# Patient Record
Sex: Female | Born: 1960 | Race: White | Hispanic: No | Smoking: Never smoker
Health system: Southern US, Community
[De-identification: ages and names within clinical notes are randomized; demographics above are authoritative.]

## PROBLEM LIST (undated history)

## (undated) DIAGNOSIS — I471 Supraventricular tachycardia, unspecified: Secondary | ICD-10-CM

## (undated) DIAGNOSIS — N809 Endometriosis, unspecified: Secondary | ICD-10-CM

## (undated) DIAGNOSIS — B0052 Herpesviral keratitis: Secondary | ICD-10-CM

## (undated) DIAGNOSIS — J9819 Other pulmonary collapse: Secondary | ICD-10-CM

## (undated) HISTORY — PX: ABDOMINAL HYSTERECTOMY: SHX81

## (undated) HISTORY — DX: Herpesviral keratitis: B00.52

## (undated) HISTORY — PX: PARTIAL HYSTERECTOMY: SHX80

---

## 1998-08-30 ENCOUNTER — Inpatient Hospital Stay (HOSPITAL_COMMUNITY): Admission: RE | Admit: 1998-08-30 | Discharge: 1998-09-01 | Payer: Self-pay | Admitting: Obstetrics and Gynecology

## 1999-12-26 ENCOUNTER — Other Ambulatory Visit: Admission: RE | Admit: 1999-12-26 | Discharge: 1999-12-26 | Payer: Self-pay | Admitting: Obstetrics and Gynecology

## 2001-01-26 ENCOUNTER — Other Ambulatory Visit: Admission: RE | Admit: 2001-01-26 | Discharge: 2001-01-26 | Payer: Self-pay | Admitting: Obstetrics and Gynecology

## 2004-01-31 ENCOUNTER — Other Ambulatory Visit: Admission: RE | Admit: 2004-01-31 | Discharge: 2004-01-31 | Payer: Self-pay | Admitting: Obstetrics and Gynecology

## 2005-02-12 ENCOUNTER — Other Ambulatory Visit: Admission: RE | Admit: 2005-02-12 | Discharge: 2005-02-12 | Payer: Self-pay | Admitting: Obstetrics and Gynecology

## 2009-09-25 ENCOUNTER — Encounter: Admission: RE | Admit: 2009-09-25 | Discharge: 2009-09-25 | Payer: Self-pay | Admitting: Obstetrics and Gynecology

## 2010-12-22 ENCOUNTER — Encounter: Payer: Self-pay | Admitting: Obstetrics and Gynecology

## 2011-08-29 DIAGNOSIS — H169 Unspecified keratitis: Secondary | ICD-10-CM

## 2011-08-29 HISTORY — DX: Unspecified keratitis: H16.9

## 2012-01-09 DIAGNOSIS — H04123 Dry eye syndrome of bilateral lacrimal glands: Secondary | ICD-10-CM

## 2012-01-09 DIAGNOSIS — H52202 Unspecified astigmatism, left eye: Secondary | ICD-10-CM | POA: Insufficient documentation

## 2012-01-09 HISTORY — DX: Unspecified astigmatism, left eye: H52.202

## 2012-01-09 HISTORY — DX: Dry eye syndrome of bilateral lacrimal glands: H04.123

## 2013-05-08 DIAGNOSIS — K120 Recurrent oral aphthae: Secondary | ICD-10-CM

## 2013-05-08 HISTORY — DX: Recurrent oral aphthae: K12.0

## 2013-12-10 ENCOUNTER — Encounter (HOSPITAL_BASED_OUTPATIENT_CLINIC_OR_DEPARTMENT_OTHER): Payer: Self-pay | Admitting: Emergency Medicine

## 2013-12-10 ENCOUNTER — Emergency Department (HOSPITAL_BASED_OUTPATIENT_CLINIC_OR_DEPARTMENT_OTHER)
Admission: EM | Admit: 2013-12-10 | Discharge: 2013-12-10 | Disposition: A | Discharge status: Home / Self Care | Payer: BC Managed Care – PPO | Attending: Emergency Medicine | Admitting: Emergency Medicine

## 2013-12-10 DIAGNOSIS — W540XXA Bitten by dog, initial encounter: Secondary | ICD-10-CM | POA: Insufficient documentation

## 2013-12-10 DIAGNOSIS — I471 Supraventricular tachycardia, unspecified: Secondary | ICD-10-CM

## 2013-12-10 DIAGNOSIS — S61409A Unspecified open wound of unspecified hand, initial encounter: Secondary | ICD-10-CM | POA: Insufficient documentation

## 2013-12-10 DIAGNOSIS — Z3202 Encounter for pregnancy test, result negative: Secondary | ICD-10-CM | POA: Insufficient documentation

## 2013-12-10 DIAGNOSIS — R42 Dizziness and giddiness: Secondary | ICD-10-CM | POA: Insufficient documentation

## 2013-12-10 DIAGNOSIS — Y939 Activity, unspecified: Secondary | ICD-10-CM | POA: Insufficient documentation

## 2013-12-10 DIAGNOSIS — E876 Hypokalemia: Secondary | ICD-10-CM

## 2013-12-10 DIAGNOSIS — I498 Other specified cardiac arrhythmias: Secondary | ICD-10-CM | POA: Insufficient documentation

## 2013-12-10 DIAGNOSIS — S61451A Open bite of right hand, initial encounter: Secondary | ICD-10-CM

## 2013-12-10 DIAGNOSIS — E860 Dehydration: Secondary | ICD-10-CM

## 2013-12-10 DIAGNOSIS — Z79899 Other long term (current) drug therapy: Secondary | ICD-10-CM | POA: Insufficient documentation

## 2013-12-10 DIAGNOSIS — Y929 Unspecified place or not applicable: Secondary | ICD-10-CM | POA: Insufficient documentation

## 2013-12-10 HISTORY — DX: Supraventricular tachycardia, unspecified: I47.10

## 2013-12-10 HISTORY — DX: Supraventricular tachycardia: I47.1

## 2013-12-10 LAB — CBC WITH DIFFERENTIAL/PLATELET
BASOS ABS: 0 10*3/uL (ref 0.0–0.1)
BASOS PCT: 0 % (ref 0–1)
EOS ABS: 0 10*3/uL (ref 0.0–0.7)
EOS PCT: 0 % (ref 0–5)
HCT: 42.3 % (ref 36.0–46.0)
HEMOGLOBIN: 14.1 g/dL (ref 12.0–15.0)
LYMPHS ABS: 2 10*3/uL (ref 0.7–4.0)
Lymphocytes Relative: 17 % (ref 12–46)
MCH: 31.1 pg (ref 26.0–34.0)
MCHC: 33.3 g/dL (ref 30.0–36.0)
MCV: 93.2 fL (ref 78.0–100.0)
MONO ABS: 0.5 10*3/uL (ref 0.1–1.0)
MONOS PCT: 4 % (ref 3–12)
Neutro Abs: 9.2 10*3/uL — ABNORMAL HIGH (ref 1.7–7.7)
Neutrophils Relative %: 79 % — ABNORMAL HIGH (ref 43–77)
Platelets: 283 10*3/uL (ref 150–400)
RBC: 4.54 MIL/uL (ref 3.87–5.11)
RDW: 13.3 % (ref 11.5–15.5)
WBC: 11.7 10*3/uL — AB (ref 4.0–10.5)

## 2013-12-10 LAB — BASIC METABOLIC PANEL
BUN: 24 mg/dL — AB (ref 6–23)
CALCIUM: 9.8 mg/dL (ref 8.4–10.5)
CO2: 26 mEq/L (ref 19–32)
Chloride: 102 mEq/L (ref 96–112)
Creatinine, Ser: 0.8 mg/dL (ref 0.50–1.10)
GFR calc Af Amer: >90 mL/min (ref >90)
GFR, EST NON AFRICAN AMERICAN: 84 mL/min — AB (ref >90)
Glucose, Bld: 182 mg/dL — ABNORMAL HIGH (ref 70–99)
Potassium: 3.6 mEq/L — ABNORMAL LOW (ref 3.7–5.3)
Sodium: 144 mEq/L (ref 137–147)

## 2013-12-10 LAB — URINALYSIS, ROUTINE W REFLEX MICROSCOPIC
BILIRUBIN URINE: NEGATIVE
GLUCOSE, UA: 100 mg/dL — AB
HGB URINE DIPSTICK: NEGATIVE
Ketones, ur: NEGATIVE mg/dL
LEUKOCYTES UA: NEGATIVE
NITRITE: NEGATIVE
PROTEIN: NEGATIVE mg/dL
SPECIFIC GRAVITY, URINE: 1.003 — AB (ref 1.005–1.030)
Urobilinogen, UA: 0.2 mg/dL (ref 0.0–1.0)
pH: 7 (ref 5.0–8.0)

## 2013-12-10 LAB — PREGNANCY, URINE: Preg Test, Ur: NEGATIVE

## 2013-12-10 MED ORDER — POTASSIUM CHLORIDE CRYS ER 20 MEQ PO TBCR
40.0000 meq | EXTENDED_RELEASE_TABLET | Freq: Once | ORAL | Status: AC
  Administered 2013-12-10: 40 meq via ORAL
  Filled 2013-12-10: qty 2

## 2013-12-10 MED ORDER — AMOXICILLIN-POT CLAVULANATE 875-125 MG PO TABS: 1.0000 | ORAL_TABLET | Freq: Two times a day (BID) | ORAL | Status: DC

## 2013-12-10 MED ORDER — ADENOSINE 6 MG/2ML IV SOLN
INTRAVENOUS | Status: AC
  Filled 2013-12-10: qty 6

## 2013-12-10 NOTE — Discharge Instructions (Signed)
If you were given medicines take as directed.  If you are on coumadin or contraceptives realize their levels and effectiveness is altered by many different medicines.  If you have any reaction (rash, tongues swelling, other) to the medicines stop taking and see a physician.   Please follow up as directed and return to the ER or see a physician for new or worsening symptoms.  Thank you.  Animal Bite An animal bite can result in a scratch on the skin, deep open cut, puncture of the skin, crush injury, or tearing away of the skin or a body part. Dogs are responsible for most animal bites. Children are bitten more often than adults. An animal bite can range from very mild to more serious. A small bite from your house pet is no cause for alarm. However, some animal bites can become infected or injure a bone or other tissue. You must seek medical care if:  The skin is broken and bleeding does not slow down or stop after 15 minutes.  The puncture is deep and difficult to clean (such as a cat bite).  Pain, warmth, redness, or pus develops around the wound.  The bite is from a stray animal or rodent. There may be a risk of rabies infection.  The bite is from a snake, raccoon, skunk, fox, coyote, or bat. There may be a risk of rabies infection.  The person bitten has a chronic illness such as diabetes, liver disease, or cancer, or the person takes medicine that lowers the immune system.  There is concern about the location and severity of the bite. It is important to clean and protect an animal bite wound right away to prevent infection. Follow these steps:  Clean the wound with plenty of water and soap.  Apply an antibiotic cream.  Apply gentle pressure over the wound with a clean towel or gauze to slow or stop bleeding.  Elevate the affected area above the heart to help stop any bleeding.  Seek medical care. Getting medical care within 8 hours of the animal bite leads to the best possible  outcome. DIAGNOSIS  Your caregiver will most likely:  Take a detailed history of the animal and the bite injury.  Perform a wound exam.  Take your medical history. Blood tests or X-rays may be performed. Sometimes, infected bite wounds are cultured and sent to a lab to identify the infectious bacteria.  TREATMENT  Medical treatment will depend on the location and type of animal bite as well as the patient's medical history. Treatment may include:  Wound care, such as cleaning and flushing the wound with saline solution, bandaging, and elevating the affected area.  Antibiotics.  Tetanus immunization.  Rabies immunization.  Leaving the wound open to heal. This is often done with animal bites, due to the high risk of infection. However, in certain cases, wound closure with stitches, wound adhesive, skin adhesive strips, or staples may be used. Infected bites that are left untreated may require intravenous (IV) antibiotics and surgical treatment in the hospital. HOME CARE INSTRUCTIONS  Follow your caregiver's instructions for wound care.  Take all medicines as directed.  If your caregiver prescribes antibiotics, take them as directed. Finish them even if you start to feel better.  Follow up with your caregiver for further exams or immunizations as directed. You may need a tetanus shot if:  You cannot remember when you had your last tetanus shot.  You have never had a tetanus shot.  The injury  broke your skin. If you get a tetanus shot, your arm may swell, get red, and feel warm to the touch. This is common and not a problem. If you need a tetanus shot and you choose not to have one, there is a rare chance of getting tetanus. Sickness from tetanus can be serious. SEEK MEDICAL CARE IF:  You notice warmth, redness, soreness, swelling, pus discharge, or a bad smell coming from the wound.  You have a red line on the skin coming from the wound.  You have a fever, chills, or a  general ill feeling.  You have nausea or vomiting.  You have continued or worsening pain.  You have trouble moving the injured part.  You have other questions or concerns. MAKE SURE YOU:  Understand these instructions.  Will watch your condition.  Will get help right away if you are not doing well or get worse. Document Released: 08/05/2011 Document Revised: 02/09/2012 Document Reviewed: 08/05/2011 [facility] Patient Information 2014 [employer] [place].

## 2013-12-10 NOTE — ED Provider Notes (Signed)
CSN: [id number]     Arrival date & time 12/10/13  1148 History   First MD Initiated Contact with Patient 12/10/13 1214     Chief Complaint  Patient presents with  . SVT   . Animal Bite   (Consider location/radiation/quality/duration/timing/severity/associated sxs/prior Treatment) HPI Comments: 53 yo female with SVT hx presents with right hand swelling from dog bite on Thursday, her own dog who has had Rabies vaccinations.  Mild right hand swelling, no fevers. Tetanus UTD.  Pt felt palpitations this am, constant, similar to previous SVT that she received adenosine.  No other heart issues.  NO chol, smoking, dm or htn. Pt on muscle relaxant. Pain with palpation of right hand.   Patient denies blood clot history, active cancer, recent major trauma or surgery, unilateral leg swelling/ pain, recent long travel, hemoptysis or oral contraceptives.   Patient is a 53 y.o. female presenting with animal bite. The history is provided by the patient.  Animal Bite Associated symptoms: no fever and no rash     Past Medical History  Diagnosis Date  . SVT (supraventricular tachycardia)    History reviewed. No pertinent past surgical history. No family history on file. History  Substance Use Topics  . Smoking status: Never Smoker   . Smokeless tobacco: Not on file  . Alcohol Use: Not on file   OB History   Grav Para Term Preterm Abortions TAB SAB Ect Mult Living                 Review of Systems  Constitutional: Negative for fever and chills.  HENT: Negative for congestion.   Eyes: Negative for visual disturbance.  Respiratory: Negative for shortness of breath.   Cardiovascular: Positive for palpitations. Negative for chest pain.  Gastrointestinal: Negative for vomiting and abdominal pain.  Genitourinary: Negative for dysuria and flank pain.  Musculoskeletal: Positive for joint swelling. Negative for back pain, neck pain and neck stiffness.  Skin: Positive for wound. Negative for rash.   Neurological: Positive for light-headedness. Negative for headaches.    Allergies  Review of patient's allergies indicates no known allergies.  Home Medications   Current Outpatient Rx  Name  Route  Sig  Dispense  Refill  . cyclobenzaprine (FLEXERIL) 10 MG tablet   Oral   Take 10 mg by mouth 3 (three) times daily as needed for muscle spasms.         Marland Kitchen dexamethasone (DECADRON) 4 MG tablet   Oral   Take 4 mg by mouth 2 (two) times daily with a meal.         . HYDROcodone-ibuprofen (VICOPROFEN) 7.5-200 MG per tablet   Oral   Take 1 tablet by mouth every 6 (six) hours as needed for moderate pain.         . valACYclovir (VALTREX) 1000 MG tablet   Oral   Take 1,000 mg by mouth 2 (two) times daily.          BP 135/79  Pulse 198  Temp(Src) 97.8 F (36.6 C)  Resp 20  SpO2 100% Physical Exam  Nursing note and vitals reviewed. Constitutional: She is oriented to person, place, and time. She appears well-developed and well-nourished.  HENT:  Head: Normocephalic and atraumatic.  Mild dry mm  Eyes: Conjunctivae are normal. Right eye exhibits no discharge. Left eye exhibits no discharge.  Neck: Normal range of motion. Neck supple. No tracheal deviation present.  Cardiovascular: Regular rhythm.  Tachycardia present.   No murmur heard. Pulmonary/Chest: Effort normal  and breath sounds normal.  Abdominal: Soft. She exhibits no distension. There is no tenderness. There is no guarding.  Musculoskeletal: She exhibits no edema and no tenderness.  Neurological: She is alert and oriented to person, place, and time. No cranial nerve deficit.  Skin: Skin is warm. No rash noted.  Psychiatric: She has a normal mood and affect.    ED Course  Procedures (including critical care time) Labs Review Labs Reviewed  BASIC METABOLIC PANEL - Abnormal; Notable for the following:    Potassium 3.6 (*)    Glucose, Bld 182 (*)    BUN 24 (*)    GFR calc non Af Amer 84 (*)    All other  components within normal limits  URINALYSIS, ROUTINE W REFLEX MICROSCOPIC - Abnormal; Notable for the following:    Specific Gravity, Urine 1.003 (*)    Glucose, UA 100 (*)    All other components within normal limits  PREGNANCY, URINE  CBC WITH DIFFERENTIAL   Imaging Review No results found.  EKG Interpretation    Date/Time:  Saturday December 10 2013 12:30:26 EST Ventricular Rate:  103 PR Interval:  150 QRS Duration: 117 QT Interval:  355 QTC Calculation: 465 R Axis:   84 Text Interpretation:  Sinus tachycardia Nonspecific intraventricular conduction delay Confirmed by Don Giarrusso  MD, Tylynn Braniff ([id number]) on 12/10/2013 1:04:24 PM           Date: 12/10/2013  Rate: 196  Rhythm: supraventricular tachycardia (SVT)  QRS Axis: normal  Intervals: normal  ST/T Wave abnormalities: ST depressions diffusely  Conduction Disutrbances:none  Narrative Interpretation:   Old EKG Reviewed: none available    MDM   1. SVT (supraventricular tachycardia)   2. Dehydration   3. Animal bite of hand, right, initial encounter   4. Hypokalemia    Dog bite, mild swelling, no streaking or active infection but since hand/ swelling/ bite and high risk augmentin and close fup outpt.  SVT - similar to previous- ekg normalized on repeat.  Pt did not require adenosine, stopped just prior to.  Vagal maneuvers done.  Discussed fup with cardiology. IV fluids given. PO K given. Labs done. Pt improved in ED.    Results and differential diagnosis were discussed with the patient. Close follow up outpatient was discussed, patient comfortable with the plan.   Diagnosis: above     Enid SkeensJoshua M Julie-Ann Vanmaanen, MD 12/10/13 [id number]

## 2013-12-10 NOTE — ED Notes (Signed)
Patient here for swelling to right hand after here dog bit her on Thursday, minimal swelling and small abrasion noted. On assessment HR noted to be 198, patient states she has history of same that has required intervention in past, complains of feeling anxious, no CP

## 2013-12-13 ENCOUNTER — Encounter: Payer: Self-pay | Admitting: Cardiovascular Disease

## 2013-12-13 ENCOUNTER — Ambulatory Visit (INDEPENDENT_AMBULATORY_CARE_PROVIDER_SITE_OTHER): Payer: BC Managed Care – PPO | Admitting: Cardiovascular Disease

## 2013-12-13 VITALS — BP 126/92 | HR 84 | Ht 65.0 in | Wt 163.0 lb

## 2013-12-13 DIAGNOSIS — I471 Supraventricular tachycardia: Secondary | ICD-10-CM | POA: Insufficient documentation

## 2013-12-13 DIAGNOSIS — I498 Other specified cardiac arrhythmias: Secondary | ICD-10-CM

## 2013-12-13 MED ORDER — DILTIAZEM HCL ER COATED BEADS 120 MG PO CP24: 120.0000 mg | ORAL_CAPSULE | Freq: Every day | ORAL | Status: DC

## 2013-12-13 NOTE — Patient Instructions (Signed)
Your physician recommends that you schedule a follow-up appointment in: 6 weeks.   Your physician has requested that you have an echocardiogram. Echocardiography is a painless test that uses sound waves to create images of your heart. It provides your doctor with information about the size and shape of your heart and how well your heart's chambers and valves are working. This procedure takes approximately one hour. There are no restrictions for this procedure.   Your physician has recommended you make the following change in your medication:  Start Cardizem CD 120 mg by mouth daily

## 2013-12-13 NOTE — Progress Notes (Addendum)
History of Present Illness: 53 yo female with history of SVT who is here today to establish cardiology care. She has not been seen in our office before today. She has been told that she has had SVT in the past. She was seen in the ED 12/10/13 with c/o palpitations. She was found to be in a narrow complex tachycardia c/w SVT with HR of 196. She returned to normal sinus rhythm with vagal maneuvers. She notes having several episodes per year over the last ten years. Overall fatigue. No chest pain. Alcohol seems to trigger her episodes.   Primary Care Physician: Malva LimesMark Anderson [facility]  Past Medical History  Diagnosis Date  . SVT (supraventricular tachycardia)   . Keratitis, herpetic     Past Surgical History  Procedure Laterality Date  . Partial hysterectomy      Current Outpatient Prescriptions  Medication Sig Dispense Refill  . cyclobenzaprine (FLEXERIL) 10 MG tablet Take 10 mg by mouth 3 (three) times daily as needed for muscle spasms.      . prednisoLONE acetate (PRED FORTE) 1 % ophthalmic suspension Place 6 drops into the right eye. PUT 6 DROPS ON RIGHT EYE ONLY THROUGHOUT THE DAY      . valACYclovir (VALTREX) 1000 MG tablet Take 1,000 mg by mouth 2 (two) times daily.      Marland Kitchen. amoxicillin-clavulanate (AUGMENTIN) 875-125 MG per tablet Take 1 tablet by mouth 2 (two) times daily. One po bid x 7 days  14 tablet  0  . diltiazem (CARDIZEM CD) 120 MG 24 hr capsule Take 1 capsule (120 mg total) by mouth daily.  30 capsule  11   No current facility-administered medications for this visit.    Allergies  Allergen Reactions  . Amoxicillin     MILD RASH  . Zithromax [Azithromycin]     ITCHING AND RASH ON HER FEET AND PALM    History   Social History  . Marital Status: Married    Spouse Name: N/A    Number of Children: 2  . Years of Education: N/A   Occupational History  . Paralegal    Social History Main Topics  . Smoking status: Never Smoker   . Smokeless tobacco:  Not on file  . Alcohol Use: No  . Drug Use: No  . Sexual Activity: Not on file   Other Topics Concern  . Not on file   Social History Narrative  . No narrative on file    Family History  Problem Relation Age of Onset  . Heart attack Father 1760  . Heart attack Maternal Grandmother   . Heart attack Maternal Grandfather     Review of Systems:  As stated in the HPI and otherwise negative.   BP 126/92  Pulse 84  Ht 5\' 5"  (1.651 m)  Wt 163 lb (73.936 kg)  BMI 27.12 kg/m2  Physical Examination: General: Well developed, well nourished, NAD HEENT: OP clear, mucus membranes moist SKIN: warm, dry. No rashes. Neuro: No focal deficits Musculoskeletal: Muscle strength 5/5 all ext Psychiatric: Mood and affect normal Neck: No JVD, no carotid bruits, no thyromegaly, no lymphadenopathy. Lungs:Clear bilaterally, no wheezes, rhonci, crackles Cardiovascular: Regular rate and rhythm. No murmurs, gallops or rubs. Abdomen:Soft. Bowel sounds present. Non-tender.  Extremities: No lower extremity edema. Pulses are 2 + in the bilateral DP/PT.  EKG: NSR, rate 85 bpm.   Assessment and Plan:   1. SVT: Documented in ED. She notices similar episodes several times per year.  Alcohol seems to be a trigger. I have spent 20 minutes reviewing the etiology of SVT and provided patient education information. Will start Cardizem CD 120 mg po QDaily. She will try taking every day but may end up using as needed. I have gone over vagal maneuvers to use if she has recurrence of symptoms. Will arrange echo to assess LVEF and exclude structural heart disease. She is asked to avoid stimulants such as nicotine, OTC medications with stimulant qualities, alcohol. She will reduce caffeine intake.

## 2013-12-28 ENCOUNTER — Encounter: Payer: Self-pay | Admitting: Cardiology

## 2013-12-28 ENCOUNTER — Ambulatory Visit (HOSPITAL_COMMUNITY): Payer: BC Managed Care – PPO | Attending: Cardiology | Admitting: Cardiology

## 2013-12-28 DIAGNOSIS — I498 Other specified cardiac arrhythmias: Secondary | ICD-10-CM

## 2013-12-28 DIAGNOSIS — I471 Supraventricular tachycardia, unspecified: Secondary | ICD-10-CM | POA: Insufficient documentation

## 2013-12-28 NOTE — Progress Notes (Signed)
Echo performed. 

## 2014-01-26 ENCOUNTER — Ambulatory Visit: Payer: BC Managed Care – PPO | Admitting: Cardiovascular Disease

## 2014-01-30 ENCOUNTER — Ambulatory Visit (INDEPENDENT_AMBULATORY_CARE_PROVIDER_SITE_OTHER): Payer: BC Managed Care – PPO | Admitting: Cardiovascular Disease

## 2014-01-30 ENCOUNTER — Encounter: Payer: Self-pay | Admitting: Cardiovascular Disease

## 2014-01-30 VITALS — BP 124/82 | HR 81 | Ht 65.0 in | Wt 161.4 lb

## 2014-01-30 DIAGNOSIS — I498 Other specified cardiac arrhythmias: Secondary | ICD-10-CM

## 2014-01-30 DIAGNOSIS — I471 Supraventricular tachycardia: Secondary | ICD-10-CM

## 2014-01-30 NOTE — Progress Notes (Signed)
History of Present Illness: 53 yo female with history of SVT who is here today for cardiac follow up. I met her as a new patient 12/13/13 for evaluation of SVT.  She was seen in the [facility] ED 12/10/13 with c/o palpitations. She was found to be in a narrow complex tachycardia c/w SVT with HR of 196. She returned to normal sinus rhythm with vagal maneuvers. She notes having several episodes of tachycardia per year over the last ten years. She reported overall fatigue, but no chest pain. Alcohol seems to trigger her episodes. I added Cardizem CD 120 mg po Qdaily. Echo 12/28/13 with normal LV function, no significant valve issues.   She is here today for follow up. She had not been taking her Cardizem as recommended and had one episode of tachycardia lasting 2 hours last week. Has now started Cardizem CD 120 mg daily and doing well. No recurrence over last week. No other complaints.   Primary Care Physician: Freda Munro ([place])  Past Medical History  Diagnosis Date  . SVT (supraventricular tachycardia)   . Keratitis, herpetic     Past Surgical History  Procedure Laterality Date  . Partial hysterectomy      Current Outpatient Prescriptions  Medication Sig Dispense Refill  . diltiazem (CARDIZEM CD) 120 MG 24 hr capsule Take 1 capsule (120 mg total) by mouth daily.  30 capsule  11  . valACYclovir (VALTREX) 1000 MG tablet Take 1,000 mg by mouth daily.        No current facility-administered medications for this visit.    Allergies  Allergen Reactions  . Amoxicillin     MILD RASH  . Zithromax [Azithromycin] Other (See Comments)    ITCHING AND RASH ON HER FEET AND PALM    History   Social History  . Marital Status: Married    Spouse Name: N/A    Number of Children: 2  . Years of Education: N/A   Occupational History  . Paralegal    Social History Main Topics  . Smoking status: Never Smoker   . Smokeless tobacco: Not on file  . Alcohol Use: No  . Drug Use: No  .  Sexual Activity: Not on file   Other Topics Concern  . Not on file   Social History Narrative  . No narrative on file    Family History  Problem Relation Age of Onset  . Heart attack Father 34  . Heart attack Maternal Grandmother   . Heart attack Maternal Grandfather     Review of Systems:  As stated in the HPI and otherwise negative.   BP 124/82  Pulse 81  Ht 5' 5"  (1.651 m)  Wt 161 lb 6.4 oz (73.211 kg)  BMI 26.86 kg/m2  Physical Examination: General: Well developed, well nourished, NAD HEENT: OP clear, mucus membranes moist SKIN: warm, dry. No rashes. Neuro: No focal deficits Musculoskeletal: Muscle strength 5/5 all ext Psychiatric: Mood and affect normal Neck: No JVD, no carotid bruits, no thyromegaly, no lymphadenopathy. Lungs:Clear bilaterally, no wheezes, rhonci, crackles Cardiovascular: Regular rate and rhythm. No murmurs, gallops or rubs. Abdomen:Soft. Bowel sounds present. Non-tender.  Extremities: No lower extremity edema. Pulses are 2 + in the bilateral DP/PT.  Assessment and Plan:   1. SVT: Documented in ED in January 2015. She had not started Cardizem and had another episodes last week. Echo overall normal. Will start Cardizem CD 120 mg po Qdaily. I have gone over vagal maneuvers to use if she  has recurrence of symptoms. She is asked to avoid stimulants such as nicotine, OTC medications with stimulant qualities, alcohol. She will reduce caffeine intake. If we are unable to control symptoms with medications alone, she would be a good candidate for ablation.

## 2014-01-30 NOTE — Patient Instructions (Signed)
Your physician wants you to follow-up in:  6 months. You will receive a reminder letter in the mail two months in advance. If you don't receive a letter, please call our office to schedule the follow-up appointment.   

## 2014-03-13 ENCOUNTER — Ambulatory Visit: Payer: BC Managed Care – PPO | Admitting: Cardiovascular Disease

## 2014-12-19 ENCOUNTER — Encounter: Payer: Self-pay | Admitting: Cardiovascular Disease

## 2014-12-21 ENCOUNTER — Other Ambulatory Visit: Payer: Self-pay | Admitting: Obstetrics and Gynecology

## 2014-12-21 DIAGNOSIS — R928 Other abnormal and inconclusive findings on diagnostic imaging of breast: Secondary | ICD-10-CM

## 2014-12-29 ENCOUNTER — Ambulatory Visit
Admission: RE | Admit: 2014-12-29 | Discharge: 2014-12-29 | Disposition: A | Discharge status: Home / Self Care | Payer: BLUE CROSS/BLUE SHIELD | Source: Ambulatory Visit | Attending: Obstetrics and Gynecology | Admitting: Obstetrics and Gynecology

## 2014-12-29 ENCOUNTER — Other Ambulatory Visit: Payer: Self-pay | Admitting: Obstetrics and Gynecology

## 2014-12-29 DIAGNOSIS — R928 Other abnormal and inconclusive findings on diagnostic imaging of breast: Secondary | ICD-10-CM

## 2015-03-13 ENCOUNTER — Other Ambulatory Visit: Payer: Self-pay | Admitting: Cardiovascular Disease

## 2015-05-11 DIAGNOSIS — H2513 Age-related nuclear cataract, bilateral: Secondary | ICD-10-CM

## 2015-05-11 DIAGNOSIS — H16301 Unspecified interstitial keratitis, right eye: Secondary | ICD-10-CM

## 2015-05-11 DIAGNOSIS — H43813 Vitreous degeneration, bilateral: Secondary | ICD-10-CM

## 2015-05-11 HISTORY — DX: Vitreous degeneration, bilateral: H43.813

## 2015-05-11 HISTORY — DX: Unspecified interstitial keratitis, right eye: H16.301

## 2015-05-11 HISTORY — DX: Age-related nuclear cataract, bilateral: H25.13

## 2015-08-23 DIAGNOSIS — H40041 Steroid responder, right eye: Secondary | ICD-10-CM

## 2015-08-23 HISTORY — DX: Steroid responder, right eye: H40.041

## 2016-01-25 ENCOUNTER — Other Ambulatory Visit: Payer: Self-pay | Admitting: Obstetrics and Gynecology

## 2016-01-25 DIAGNOSIS — R928 Other abnormal and inconclusive findings on diagnostic imaging of breast: Secondary | ICD-10-CM

## 2016-01-31 ENCOUNTER — Ambulatory Visit
Admission: RE | Admit: 2016-01-31 | Discharge: 2016-01-31 | Disposition: A | Discharge status: Home / Self Care | Payer: BLUE CROSS/BLUE SHIELD | Source: Ambulatory Visit | Attending: Obstetrics and Gynecology | Admitting: Obstetrics and Gynecology

## 2016-01-31 DIAGNOSIS — R928 Other abnormal and inconclusive findings on diagnostic imaging of breast: Secondary | ICD-10-CM

## 2017-09-17 ENCOUNTER — Other Ambulatory Visit: Payer: Self-pay | Admitting: Obstetrics and Gynecology

## 2017-09-17 DIAGNOSIS — Z1231 Encounter for screening mammogram for malignant neoplasm of breast: Secondary | ICD-10-CM

## 2017-11-06 ENCOUNTER — Ambulatory Visit: Payer: BLUE CROSS/BLUE SHIELD

## 2017-11-09 ENCOUNTER — Ambulatory Visit: Payer: BLUE CROSS/BLUE SHIELD

## 2017-12-11 ENCOUNTER — Ambulatory Visit: Payer: BLUE CROSS/BLUE SHIELD

## 2017-12-25 ENCOUNTER — Ambulatory Visit: Payer: BLUE CROSS/BLUE SHIELD

## 2018-01-22 ENCOUNTER — Ambulatory Visit
Admission: RE | Admit: 2018-01-22 | Discharge: 2018-01-22 | Disposition: A | Discharge status: Home / Self Care | Payer: 59 | Source: Ambulatory Visit | Attending: Obstetrics and Gynecology | Admitting: Obstetrics and Gynecology

## 2018-01-22 DIAGNOSIS — Z1231 Encounter for screening mammogram for malignant neoplasm of breast: Secondary | ICD-10-CM

## 2019-02-03 DIAGNOSIS — Z803 Family history of malignant neoplasm of breast: Secondary | ICD-10-CM | POA: Insufficient documentation

## 2019-02-03 HISTORY — DX: Family history of malignant neoplasm of breast: Z80.3

## 2019-09-30 DIAGNOSIS — F411 Generalized anxiety disorder: Secondary | ICD-10-CM

## 2019-09-30 HISTORY — DX: Generalized anxiety disorder: F41.1

## 2019-10-10 DIAGNOSIS — E559 Vitamin D deficiency, unspecified: Secondary | ICD-10-CM

## 2019-10-10 HISTORY — DX: Vitamin D deficiency, unspecified: E55.9

## 2020-02-08 ENCOUNTER — Encounter (HOSPITAL_BASED_OUTPATIENT_CLINIC_OR_DEPARTMENT_OTHER): Payer: Self-pay | Admitting: Emergency Medicine

## 2020-02-08 ENCOUNTER — Other Ambulatory Visit: Payer: Self-pay

## 2020-02-08 ENCOUNTER — Emergency Department (HOSPITAL_BASED_OUTPATIENT_CLINIC_OR_DEPARTMENT_OTHER): Payer: 59

## 2020-02-08 ENCOUNTER — Emergency Department (HOSPITAL_BASED_OUTPATIENT_CLINIC_OR_DEPARTMENT_OTHER)
Admission: EM | Admit: 2020-02-08 | Discharge: 2020-02-08 | Disposition: A | Discharge status: Home / Self Care | Payer: 59 | Attending: Emergency Medicine | Admitting: Emergency Medicine

## 2020-02-08 DIAGNOSIS — I10 Essential (primary) hypertension: Secondary | ICD-10-CM | POA: Insufficient documentation

## 2020-02-08 DIAGNOSIS — Z881 Allergy status to other antibiotic agents status: Secondary | ICD-10-CM | POA: Diagnosis not present

## 2020-02-08 DIAGNOSIS — Z79899 Other long term (current) drug therapy: Secondary | ICD-10-CM | POA: Diagnosis not present

## 2020-02-08 DIAGNOSIS — R0789 Other chest pain: Secondary | ICD-10-CM | POA: Diagnosis present

## 2020-02-08 DIAGNOSIS — Z880 Allergy status to penicillin: Secondary | ICD-10-CM | POA: Insufficient documentation

## 2020-02-08 HISTORY — DX: Other pulmonary collapse: J98.19

## 2020-02-08 HISTORY — DX: Endometriosis, unspecified: N80.9

## 2020-02-08 LAB — CBC
HCT: 40.5 % (ref 36.0–46.0)
Hemoglobin: 13.6 g/dL (ref 12.0–15.0)
MCH: 31.6 pg (ref 26.0–34.0)
MCHC: 33.6 g/dL (ref 30.0–36.0)
MCV: 94.2 fL (ref 80.0–100.0)
Platelets: 256 10*3/uL (ref 150–400)
RBC: 4.3 MIL/uL (ref 3.87–5.11)
RDW: 14 % (ref 11.5–15.5)
WBC: 5.7 10*3/uL (ref 4.0–10.5)
nRBC: 0 % (ref 0.0–0.2)

## 2020-02-08 LAB — BASIC METABOLIC PANEL
Anion gap: 11 (ref 5–15)
BUN: 14 mg/dL (ref 6–20)
CO2: 23 mmol/L (ref 22–32)
Calcium: 9.8 mg/dL (ref 8.9–10.3)
Chloride: 106 mmol/L (ref 98–111)
Creatinine, Ser: 0.92 mg/dL (ref 0.44–1.00)
GFR calc Af Amer: >60 mL/min (ref >60)
GFR calc non Af Amer: >60 mL/min (ref >60)
Glucose, Bld: 143 mg/dL — ABNORMAL HIGH (ref 70–99)
Potassium: 3.6 mmol/L (ref 3.5–5.1)
Sodium: 140 mmol/L (ref 135–145)

## 2020-02-08 LAB — TROPONIN I (HIGH SENSITIVITY)
Troponin I (High Sensitivity): <2 ng/L (ref <18)
Troponin I (High Sensitivity): <2 ng/L (ref <18)

## 2020-02-08 LAB — D-DIMER, QUANTITATIVE: D-Dimer, Quant: 0.38 ug/mL-FEU (ref 0.00–0.50)

## 2020-02-08 MED ORDER — AMLODIPINE BESYLATE 5 MG PO TABS: 5.0000 mg | ORAL_TABLET | Freq: Every day | ORAL | 0 refills | Status: DC

## 2020-02-08 MED ORDER — NITROGLYCERIN 0.4 MG SL SUBL
0.4000 mg | SUBLINGUAL_TABLET | SUBLINGUAL | Status: DC | PRN
  Administered 2020-02-08 (×3): 0.4 mg via SUBLINGUAL
  Filled 2020-02-08: qty 1

## 2020-02-08 MED ORDER — ASPIRIN EC 325 MG PO TBEC
325.0000 mg | DELAYED_RELEASE_TABLET | Freq: Once | ORAL | Status: AC
  Administered 2020-02-08: 325 mg via ORAL
  Filled 2020-02-08: qty 1

## 2020-02-08 MED FILL — AMLODIPINE BESYLATE 5 MG TA: 5 | 30 days supply | Qty: 30 | Fill #0

## 2020-02-08 NOTE — ED Notes (Signed)
Patient transported to X-ray 

## 2020-02-08 NOTE — ED Triage Notes (Signed)
SOB and "Chest heaviness" x3 days. Hx of SVT.

## 2020-02-08 NOTE — ED Notes (Signed)
ED Provider at bedside. 

## 2020-02-08 NOTE — ED Provider Notes (Signed)
[facility] EMERGENCY DEPARTMENT Provider Note   CSN: [id number] Arrival date & time: 02/08/20  0845     History Chief Complaint  Patient presents with  . Chest Pain    Julia Mckinney is a 59 y.o. female.  Pt presents to the ED today with chest heaviness and sob that has been going on since 2/7.  Pt said it is worse with ambulation.  Pt has also noticed that her bp has been high for a few weeks.  Pt does not have a hx of HTN.  Pt does have a hx of SVT, but has not felt her pulse racing.  No f/c.  No cough.        Past Medical History:  Diagnosis Date  . Collapsed lung   . Endometriosis   . Keratitis, herpetic   . SVT (supraventricular tachycardia) [facility])     Patient Active Problem List   Diagnosis Date Noted  . SVT (supraventricular tachycardia) (HCC) 12/13/2013    Past Surgical History:  Procedure Laterality Date  . ABDOMINAL HYSTERECTOMY    . PARTIAL HYSTERECTOMY       OB History   No obstetric history on file.     Family History  Problem Relation Age of Onset  . Heart attack Father 25  . Heart attack Maternal Grandmother   . Heart attack Maternal Grandfather   . Breast cancer Neg Hx     Social History   Tobacco Use  . Smoking status: Never Smoker  . Smokeless tobacco: Never Used  Substance Use Topics  . Alcohol use: No  . Drug use: No    Home Medications Prior to Admission medications   Medication Sig Start Date End Date Taking? Authorizing Provider  amLODipine (NORVASC) 5 MG tablet Take 1 tablet (5 mg total) by mouth daily. 02/08/20   Jacalyn Lefevre, MD  Ergocalciferol (VITAMIN D2) 10 MCG (400 UNIT) TABS SMARTSIG:1 Capsule(s) By Mouth Once a Week 10/14/19   [provider]  valACYclovir (VALTREX) 1000 MG tablet Take 1,000 mg by mouth daily.     [provider]    Allergies    Amoxicillin and Zithromax [azithromycin]  Review of Systems   Review of Systems  Cardiovascular: Positive for chest pain.  All other  systems reviewed and are negative.   Physical Exam Updated Vital Signs BP 133/73   Pulse 65   Temp 98.4 F (36.9 C) (Oral)   Resp 17   Ht 5\' 5"  (1.651 m)   Wt 95.3 kg   SpO2 96%   BMI 34.95 kg/m   Physical Exam Vitals and nursing note reviewed.  Constitutional:      Appearance: She is well-developed.  HENT:     Head: Normocephalic and atraumatic.  Eyes:     Extraocular Movements: Extraocular movements intact.     Pupils: Pupils are equal, round, and reactive to light.     Comments: Scar on right eye (chronic hx keratitis)  Cardiovascular:     Rate and Rhythm: Normal rate and regular rhythm.     Heart sounds: Normal heart sounds.  Pulmonary:     Effort: Pulmonary effort is normal.     Breath sounds: Normal breath sounds.  Abdominal:     General: Bowel sounds are normal.     Palpations: Abdomen is soft.  Musculoskeletal:        General: Normal range of motion.     Cervical back: Normal range of motion and neck supple.  Skin:  General: Skin is warm.     Capillary Refill: Capillary refill takes less than 2 seconds.  Neurological:     General: No focal deficit present.     Mental Status: She is alert and oriented to person, place, and time.  Psychiatric:        Mood and Affect: Mood normal.        Behavior: Behavior normal.     ED Results / Procedures / Treatments   Labs (all labs ordered are listed, but only abnormal results are displayed) Labs Reviewed  BASIC METABOLIC PANEL - Abnormal; Notable for the following components:      Result Value   Glucose, Bld 143 (*)    All other components within normal limits  CBC  D-DIMER, QUANTITATIVE (NOT AT [facility])  TROPONIN I (HIGH SENSITIVITY)  TROPONIN I (HIGH SENSITIVITY)    EKG EKG Interpretation  Date/Time:  Wednesday February 08 2020 08:51:50 EST Ventricular Rate:  83 PR Interval:    QRS Duration: 128 QT Interval:  382 QTC Calculation: 449 R Axis:   50 Text Interpretation: Sinus rhythm IVCD, consider  atypical RBBB Baseline wander in lead(s) V2 No significant change since last tracing Confirmed by Isla Pence [phone]) on 02/08/2020 9:45:34 AM   Radiology DG Chest 2 View  Result Date: 02/08/2020 CLINICAL DATA:  Chest pressure, shortness of breath EXAM: CHEST - 2 VIEW COMPARISON:  None FINDINGS: Heart and mediastinal contours are within normal limits. No focal opacities or effusions. No acute bony abnormality. IMPRESSION: No active cardiopulmonary disease. Electronically Signed   By: Rolm Baptise M.D.   On: 02/08/2020 09:56    Procedures Procedures (including critical care time)  Medications Ordered in ED Medications  nitroGLYCERIN (NITROSTAT) SL tablet 0.4 mg (0.4 mg Sublingual Given 02/08/20 0934)  aspirin EC tablet 325 mg (325 mg Oral Given 02/08/20 7672)    ED Course  I have reviewed the triage vital signs and the nursing notes.  Pertinent labs & imaging results that were available during my care of the patient were reviewed by me and considered in my medical decision making (see chart for details).    MDM Rules/Calculators/A&P                      Pt is feeling much better.  The pt's bp is improved with nitro.  She has had several readings over the last month that have been high.  She will be started on norvasc.  The pt has seen Dr. Angelena Form in the past for SVT, but requests to go to cardiology here as this location is much more convenient.  Pt given a referral to cards here.  Pt knows to return if worse.  F/u with pcp.   Final Clinical Impression(s) / ED Diagnoses Final diagnoses:  Atypical chest pain  Essential hypertension    Rx / DC Orders ED Discharge Orders         Ordered    Ambulatory referral to Cardiology     02/08/20 1202    amLODipine (NORVASC) 5 MG tablet  Daily     02/08/20 1204           Isla Pence, MD 02/08/20 1208

## 2020-02-14 ENCOUNTER — Ambulatory Visit: Payer: 59 | Admitting: Cardiology

## 2020-02-22 ENCOUNTER — Other Ambulatory Visit: Payer: Self-pay

## 2020-02-22 DIAGNOSIS — Z90710 Acquired absence of both cervix and uterus: Secondary | ICD-10-CM

## 2020-02-22 HISTORY — DX: Acquired absence of both cervix and uterus: Z90.710

## 2020-02-23 ENCOUNTER — Encounter: Payer: Self-pay | Admitting: Cardiology

## 2020-02-23 ENCOUNTER — Other Ambulatory Visit: Payer: Self-pay

## 2020-02-23 ENCOUNTER — Ambulatory Visit (INDEPENDENT_AMBULATORY_CARE_PROVIDER_SITE_OTHER): Payer: 59 | Admitting: Cardiology

## 2020-02-23 VITALS — BP 130/86 | HR 76 | Ht 65.0 in | Wt 204.0 lb

## 2020-02-23 DIAGNOSIS — R079 Chest pain, unspecified: Secondary | ICD-10-CM

## 2020-02-23 DIAGNOSIS — R0609 Other forms of dyspnea: Secondary | ICD-10-CM | POA: Insufficient documentation

## 2020-02-23 DIAGNOSIS — I471 Supraventricular tachycardia: Secondary | ICD-10-CM

## 2020-02-23 DIAGNOSIS — R0600 Dyspnea, unspecified: Secondary | ICD-10-CM

## 2020-02-23 DIAGNOSIS — R5383 Other fatigue: Secondary | ICD-10-CM

## 2020-02-23 DIAGNOSIS — E669 Obesity, unspecified: Secondary | ICD-10-CM

## 2020-02-23 DIAGNOSIS — I10 Essential (primary) hypertension: Secondary | ICD-10-CM | POA: Insufficient documentation

## 2020-02-23 DIAGNOSIS — E782 Mixed hyperlipidemia: Secondary | ICD-10-CM | POA: Insufficient documentation

## 2020-02-23 DIAGNOSIS — Z6833 Body mass index (BMI) 33.0-33.9, adult: Secondary | ICD-10-CM | POA: Insufficient documentation

## 2020-02-23 MED ORDER — ROSUVASTATIN CALCIUM 10 MG PO TABS: 10.0000 mg | ORAL_TABLET | Freq: Every day | ORAL | 3 refills | Status: DC

## 2020-02-23 MED ORDER — METOPROLOL TARTRATE 100 MG PO TABS: ORAL_TABLET | ORAL | 0 refills | Status: DC

## 2020-02-23 MED ORDER — CARVEDILOL 3.125 MG PO TABS: 3.1250 mg | ORAL_TABLET | Freq: Two times a day (BID) | ORAL | 1 refills | Status: DC

## 2020-02-23 MED ORDER — NITROGLYCERIN 0.4 MG SL SUBL: 0.4000 mg | SUBLINGUAL_TABLET | SUBLINGUAL | 3 refills | Status: DC | PRN

## 2020-02-23 NOTE — Progress Notes (Signed)
.  vg 

## 2020-02-23 NOTE — Progress Notes (Signed)
Cardiology Office Note:    Date:  02/23/2020   ID:  Julia Mckinney, DOB 1961-10-01, MRN [id number]  PCP:  Loura Pardon, PA  Cardiologist:  Thomasene Ripple, DO  Electrophysiologist:  None   Referring MD: Jacalyn Lefevre, MD   Chief Complaint  Patient presents with  . chest heaviness    [facility] to [place]    History of Present Illness:    Julia Mckinney is a 59 y.o. female with a  history of paroxysmal SVT, hypertension, history of premature coronary disease presents today to be evaluated for chest tightness and heaviness.  The patient reports that since February she has been experiencing midsternal chest tightness along with heaviness.  There is no radiation associated with this.  But however she tells me there is associated shortness of breath and on the day of her presentation she did have some numbness and tingling around her mouth with a significant head pressure.  In the ED the patient was noted to be hypertensive.  She was started on amlodipine and also improved with nitro.  She has had intermittent episodes but not enough to be go to the ED.  She denies any palpitations at this time.  But her concern is a chest pressure as well as shortness of breath and generalized fatigue.  Past Medical History:  Diagnosis Date  . Astigmatism of left eye 01/09/2012  . Bilateral dry eyes 01/09/2012  . Collapsed lung   . Endometriosis   . Family history of breast cancer 02/03/2019  . Generalized anxiety disorder 09/30/2019  . History of hysterectomy 02/22/2020  . Keratitis 08/29/2011   Formatting of this note might be different from the original. HSV  . Keratitis, herpetic   . 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 Oral aphthae 05/08/2013  . PVD (posterior vitreous detachment), both eyes 05/11/2015  . 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 SVT (supraventricular tachycardia) (HCC)   . Vitamin D deficiency 10/10/2019    Past Surgical  History:  Procedure Laterality Date  . ABDOMINAL HYSTERECTOMY    . PARTIAL HYSTERECTOMY      Current Medications: Current Meds  Medication Sig  . amLODipine (NORVASC) 5 MG tablet Take 1 tablet (5 mg total) by mouth daily.  . Ergocalciferol (VITAMIN D2) 10 MCG (400 UNIT) TABS SMARTSIG:1 Capsule(s) By Mouth Once a Week  . moxifloxacin (VIGAMOX) 0.5 % ophthalmic solution Place 1 drop into the right eye 4 (four) times daily.  . Multiple Vitamins-Minerals (MULTIVITAMIN WITH MINERALS) tablet Take 1 tablet by mouth daily.  . Omega-3 Fatty Acids (FISH OIL) 1000 MG CAPS Take 1,000 mg by mouth daily.  . valACYclovir (VALTREX) 1000 MG tablet Take 1,000 mg by mouth daily.   . [DISCONTINUED] acyclovir (ZOVIRAX) 800 MG tablet Take 1 tablet by mouth daily.     Allergies:   Amoxicillin, Azithromycin, and Other   Social History   Socioeconomic History  . Marital status: Legally Separated    Spouse name: Not on file  . Number of children: 2  . Years of education: Not on file  . Highest education level: Not on file  Occupational History  . Occupation: Scientist, physiological: Air traffic controller  Tobacco Use  . Smoking status: Never Smoker  . Smokeless tobacco: Never Used  Substance and Sexual Activity  . Alcohol use: No  . Drug use: No  . Sexual activity: Not on file  Other Topics Concern  .  Not on file  Social History Narrative  . Not on file   Social Determinants of Health   Financial Resource Strain:   . Difficulty of Paying Living Expenses:   Food Insecurity:   . Worried About Charity fundraiser in the Last Year:   . Arboriculturist in the Last Year:   Transportation Needs:   . Film/video editor (Medical):   Marland Kitchen Lack of Transportation (Non-Medical):   Physical Activity:   . Days of Exercise per Week:   . Minutes of Exercise per Session:   Stress:   . Feeling of Stress :   Social Connections:   . Frequency of Communication with Friends and Family:   . Frequency of  Social Gatherings with Friends and Family:   . Attends Religious Services:   . Active Member of Clubs or Organizations:   . Attends Archivist Meetings:   Marland Kitchen Marital Status:      Family History: The patient's family history includes Asthma in her mother; Atrial fibrillation in her mother; Breast cancer in her sister; Diabetes in her mother, sister, and sister; Heart attack in her maternal grandfather and maternal grandmother; Heart attack (age of onset: 63) in her father; Hypertension in her mother.  ROS:   Review of Systems  Constitution: Reports generalized fatigue.  Negative for decreased appetite, fever and weight gain.  HENT: Negative for congestion, ear discharge, hoarse voice and sore throat.   Eyes: Negative for discharge, redness, vision loss in right eye and visual halos.  Cardiovascular: Reports chest pain, dyspnea on exertion.  Negative for, leg swelling, orthopnea and palpitations.  Respiratory: Negative for cough, hemoptysis, shortness of breath and snoring.   Endocrine: Negative for heat intolerance and polyphagia.  Hematologic/Lymphatic: Negative for bleeding problem. Does not bruise/bleed easily.  Skin: Negative for flushing, nail changes, rash and suspicious lesions.  Musculoskeletal: Negative for arthritis, joint pain, muscle cramps, myalgias, neck pain and stiffness.  Gastrointestinal: Negative for abdominal pain, bowel incontinence, diarrhea and excessive appetite.  Genitourinary: Negative for decreased libido, genital sores and incomplete emptying.  Neurological: Negative for brief paralysis, focal weakness, headaches and loss of balance.  Psychiatric/Behavioral: Negative for altered mental status, depression and suicidal ideas.  Allergic/Immunologic: Negative for HIV exposure and persistent infections.    EKGs/Labs/Other Studies Reviewed:    The following studies were reviewed today:   EKG:  The ekg ordered today demonstrates sinus rhythm, heart rate  83 bpm with underlying right bundle block.  Transthoracic echocardiogram Study Conclusions.  20 2015  - Left ventricle: The cavity size was normal. Wall thickness  was normal. Systolic function was normal. The estimated  ejection fraction was in the range of 55% to 60%. Wall  motion was normal; there were no regional wall motion  abnormalities. Doppler parameters are consistent with  abnormal left ventricular relaxation (grade 1 diastolic  dysfunction).  - Aortic valve: There was no stenosis.  - Mitral valve: No regurgitation.  - Right ventricle: The cavity size was normal. Systolic  function was normal.  - Pulmonary arteries: No complete TR doppler jet so unable  to estimate PA systolic pressure.  - Inferior vena cava: The vessel was normal in size; the  respirophasic diameter changes were in the normal range (=  50%); findings are consistent with normal central venous  pressure.  Impressions:   - Normal LV size and systolic function, EF 26-71%. Normal RV  size and systolic function. No significant valvular  abnormalities.  Transthoracic echocardiography.  M-mode, complete 2D,  spectral Doppler, and color Doppler. Height: Height:  165.1cm. Height: 65in. Weight: Weight: 73.9kg. Weight:  162.7lb. Body mass index: BMI: 27.1kg/m^2. Body surface  area:  BSA: 1.15m^2. Blood pressure:   126/92. Patient  status: Outpatient. Location: [place] [facility]   ------------------------------------------------------------   ------------------------------------------------------------  Left ventricle: The cavity size was normal. Wall thickness  was normal. Systolic function was normal. The estimated  ejection fraction was in the range of 55% to 60%. Wall  motion was normal; there were no regional wall motion  abnormalities. Doppler parameters are consistent with  abnormal left ventricular relaxation (grade 1 diastolic  dysfunction).    ------------------------------------------------------------  Aortic valve:  Trileaflet; mildly calcified leaflets.  Doppler:  There was no stenosis.  No regurgitation.   ------------------------------------------------------------  Aorta: Aortic root: The aortic root was normal in size.  Ascending aorta: The ascending aorta was normal in size.   ------------------------------------------------------------  Mitral valve:  Structurally normal valve.  Leaflet  separation was normal. Doppler: Transvalvular velocity was  within the normal range. There was no evidence for stenosis.  No regurgitation.  Peak gradient: 4mm Hg (D).   ------------------------------------------------------------  Left atrium: The atrium was normal in size.   ------------------------------------------------------------  Right ventricle: The cavity size was normal. Systolic  function was normal.   ------------------------------------------------------------  Pulmonic valve:  Structurally normal valve.  Cusp  separation was normal. Doppler: Transvalvular velocity was  within the normal range. No regurgitation.     Recent Labs: 02/08/2020: BUN 14; Creatinine, Ser 0.92; Hemoglobin 13.6; Platelets 256; Potassium 3.6; Sodium 140  Thyroid test done on October 05, 2019 showed TSH 1.5, total T4 7.3, T3 3.6 Recent Lipid Panel Lipid profile done on October 05, 2019 showed total cholesterol 243, triglyceride 116, HDL 60, LDL 162.  Physical Exam:    VS:  BP 130/86 (BP Location: Right Arm, Patient Position: Sitting, Cuff Size: Large)   Pulse 76   Ht  (1.651 m)   Wt 204 lb (92.5 kg)   SpO2 99%   BMI 33.95 kg/m     Wt Readings from Last 3 Encounters:  02/23/20 204 lb (92.5 kg)  02/08/20 210 lb (95.3 kg)  01/30/14 161 lb 6.4 oz (73.2 kg)     GEN: Well nourished, well developed in no acute distress HEENT: Normal NECK: No JVD; No carotid bruits LYMPHATICS: No lymphadenopathy CARDIAC:  S1S2 noted,RRR, no murmurs, rubs, gallops RESPIRATORY:  Clear to auscultation without rales, wheezing or rhonchi  ABDOMEN: Soft, non-tender, non-distended, +bowel sounds, no guarding. EXTREMITIES: No edema, No cyanosis, no clubbing MUSCULOSKELETAL:  No deformity  SKIN: Warm and dry NEUROLOGIC:  Alert and oriented x 3, non-focal PSYCHIATRIC:  Normal affect, good insight  ASSESSMENT:    1. Chest pain, unspecified type   2. SVT (supraventricular tachycardia) (HCC)   3. Fatigue, unspecified type   4. Dyspnea on exertion   5. Essential hypertension   6. Mixed hyperlipidemia   7. Obesity (BMI 30-39.9)    PLAN:     Her chest pressure is concerning for angina giving she does have hypertension, hyperlipidemia and a family history of first-degree relative with premature coronary artery disease.  At this time a coronary CTA would be appropriate to pursue an ischemic evaluation.  I have educated patient about this testing.  She does not have any IV contrast allergies.  She is agreeable to proceed with this testing.  Sublingual nitroglycerin prescription was sent, its protocol and 911 protocol explained and the patient  vocalized understanding questions were answered to the patient's satisfaction  Shortness of breath could be associated with her anginal symptoms, ischemic evaluation will be as above in addition I like to get a transthoracic echocardiogram to assess RV/LV function and any valvular abnormalities.  She is fatigued, however denies daytime somnolence.  For now we will get vitamin B12 levels.  Her TSH was normal back in November along with some other endocrine hormones that were done at the time.  Vitamin D was low and the patient has been started on the supplements since that time.  She is hypertensive in the office today is on amlodipine 5 mg daily I review her home blood pressure records which showed average systolic blood pressure in the 155/90 mmHg. at this time I like this at  carvedilol 3.125 mg twice daily regimen.  She was asked to continue to take her blood pressure medicines as well as take her blood pressure daily.  She will now be on amlodipine 5 mg daily and carvedilol 3.125 mg twice a day.  I reviewed her lipid profile done on October 05, 2019 at [facility] her LDL is 162.  Goal for this patient is less than 100 therefore we will start her on Crestor 10 mg daily. (Her overall ASCVD 10-year risk score is 5.0% ).  Obesity-the patient understands the need to lose weight with diet and exercise. We have discussed specific strategies for this.  History of SVT-she denies any palpitations.  She has not had a heart rate goes up over 100 in the recent past.  Therefore at this time no need for any ambulatory monitoring.  The patient is in agreement with the above plan. The patient left the office in stable condition.  The patient will follow up in 1 month for blood pressure check.   Medication Adjustments/Labs and Tests Ordered: Current medicines are reviewed at length with the patient today.  Concerns regarding medicines are outlined above.  Orders Placed This Encounter  Procedures  . CT CORONARY MORPH W/CTA COR W/SCORE W/CA W/CM &/OR WO/CM  . CT CORONARY FRACTIONAL FLOW RESERVE DATA PREP  . CT CORONARY FRACTIONAL FLOW RESERVE FLUID ANALYSIS  . B12  . Lipid panel  . ECHOCARDIOGRAM COMPLETE   Meds ordered this encounter  Medications  . carvedilol (COREG) 3.125 MG tablet    Sig: Take 1 tablet (3.125 mg total) by mouth 2 (two) times daily.    Dispense:  60 tablet    Refill:  1  . rosuvastatin (CRESTOR) 10 MG tablet    Sig: Take 1 tablet (10 mg total) by mouth daily.    Dispense:  90 tablet    Refill:  3  . metoprolol tartrate (LOPRESSOR) 100 MG tablet    Sig: TAKE 1 TABLET BY MOUTH 2 HOURS PRIOR TO YOUR CARDIAC CT    Dispense:  1 tablet    Refill:  0  . nitroGLYCERIN (NITROSTAT) 0.4 MG SL tablet    Sig: Place 1 tablet (0.4 mg total) under the tongue  every 5 (five) minutes as needed.    Dispense:  25 tablet    Refill:  3    Patient Instructions  Medication Instructions:  Your physician has recommended you make the following change in your medication:  1.  START Crestor 10 mg daily 2.  START Carvedilol 3.125 mg taking 1 twice a day 3.  START Nitroglycerin 0.4 s/l tablet.. USE AS DIRECTED   *If you need a refill on your cardiac medications before your  next appointment, please call your pharmacy*   Lab Work: TODAY:  VITAMIN B12  04/06/20:  COME TO THE OFFICE FOR FASTING CHOLESTEROL.  NOTHING TO EAT OR DRINK AFTER MIDNIGHT THE NIGHT BEFORE   If you have labs (blood work) drawn today and your tests are completely normal, you will receive your results only by: Marland Kitchen. MyChart Message (if you have MyChart) OR . A paper copy in the mail If you have any lab test that is abnormal or we need to change your treatment, we will call you to review the results.   Testing/Procedures: Your physician has requested that you have an echocardiogram. Echocardiography is a painless test that uses sound waves to create images of your heart. It provides your doctor with information about the size and shape of your heart and how well your heart's chambers and valves are working. This procedure takes approximately one hour. There are no restrictions for this procedure.   Your physician has requested that you have cardiac CT. Cardiac computed tomography (CT) is a painless test that uses an x-ray machine to take clear, detailed pictures of your heart. For further information please visit [email or web address]. Please follow instruction sheet BELOW:  Your cardiac CT will be scheduled at one of the below locations:   [facility] [address] [place], [place] [address] [phone]  OR  [facility] [address] [place], [place] [address] [phone]  If scheduled at [facility], please  arrive at the [facility] main entrance of [facility] 30 minutes prior to test start time. Proceed to the [facility] Radiology Department (first floor) to check-in and test prep.   Please follow these instructions carefully (unless otherwise directed):  On the Night Before the Test: . Be sure to Drink plenty of water. . Do not consume any caffeinated/decaffeinated beverages or chocolate 12 hours prior to your test. . Do not take any antihistamines 12 hours prior to your test.  On the Day of the Test: . Drink plenty of water. Do not drink any water within one hour of the test. . Do not eat any food 4 hours prior to the test. . You may take your regular medications prior to the test.  . Take metoprolol (Lopressor) two hours prior to test. . FEMALES- please wear underwire-free bra if available      After the Test: . Drink plenty of water. . After receiving IV contrast, you may experience a mild flushed feeling. This is normal. . On occasion, you may experience a mild rash up to 24 hours after the test. This is not dangerous. If this occurs, you can take Benadryl 25 mg and increase your fluid intake. . If you experience trouble breathing, this can be serious. If it is severe call 911 IMMEDIATELY. If it is mild, please call our office. . If you take any of these medications: Glipizide/Metformin, Avandament, Glucavance, please do not take 48 hours after completing test unless otherwise instructed.   Once we have confirmed authorization from your insurance company, we will call you to set up a date and time for your test.   For non-scheduling related questions, please contact the cardiac imaging nurse navigator should you have any questions/concerns: Rockwell AlexandriaSara Wallace, RN Navigator Cardiac Imaging [facility] [phone] office  For scheduling needs, including cancellations and rescheduling, please call [phone].          Follow-Up: At [facility], you and  your health needs are our priority.  As part of our continuing mission to provide you with exceptional heart care, we have created designated Provider Care Teams.  These Care Teams include your primary Cardiologist (physician) and Advanced Practice Providers (APPs -  Physician Assistants and Nurse Practitioners) who all work together to provide you with the care you need, when you need it.  We recommend signing up for the patient portal called "MyChart".  Sign up information is provided on this After Visit Summary.  MyChart is used to connect with patients for Virtual Visits (Telemedicine).  Patients are able to view lab/test results, encounter notes, upcoming appointments, etc.  Non-urgent messages can be sent to your provider as well.   To learn more about what you can do with MyChart, go to [email or web address].    Your next appointment:   1 month(s)  The format for your next appointment:   In Person  Provider:   Thomasene Ripple, DO   Other Instructions      Adopting a Healthy Lifestyle.  Know what a healthy weight is for you (roughly BMI <25) and aim to maintain this   Aim for 7+ servings of fruits and vegetables daily   65-80+ fluid ounces of water or unsweet tea for healthy kidneys   Limit to max 1 drink of alcohol per day; avoid smoking/tobacco   Limit animal fats in diet for cholesterol and heart health - choose grass fed whenever available   Avoid highly processed foods, and foods high in saturated/trans fats   Aim for low stress - take time to unwind and care for your mental health   Aim for 150 min of moderate intensity exercise weekly for heart health, and weights twice weekly for bone health   Aim for 7-9 hours of sleep daily   When it comes to diets, agreement about the perfect plan isnt easy to find, even among the experts. Experts at the [facility] [employer] developed an idea known as the Healthy Eating Plate. Just imagine a plate  divided into logical, healthy portions.   The emphasis is on diet quality:   Load up on vegetables and fruits - one-half of your plate: Aim for color and variety, and remember that potatoes dont count.   Go for whole grains - one-quarter of your plate: Whole wheat, barley, wheat berries, quinoa, oats, brown rice, and foods made with them. If you want pasta, go with whole wheat pasta.   Protein power - one-quarter of your plate: Fish, chicken, beans, and nuts are all healthy, versatile protein sources. Limit red meat.   The diet, however, does go beyond the plate, offering a few other suggestions.   Use healthy plant oils, such as olive, canola, soy, corn, sunflower and peanut. Check the labels, and avoid partially hydrogenated oil, which have unhealthy trans fats.   If youre thirsty, drink water. Coffee and tea are good in moderation, but skip sugary drinks and limit milk and dairy products to one or two daily servings.   The type of carbohydrate in the diet is more important than the amount. Some sources of carbohydrates, such as vegetables, fruits, whole grains, and beans-are healthier than others.   Finally, stay active  Signed, Thomasene Ripple, DO  02/23/2020 9:49 AM     [facility]

## 2020-02-23 NOTE — Patient Instructions (Addendum)
Medication Instructions:  Your physician has recommended you make the following change in your medication:  1.  START Crestor 10 mg daily 2.  START Carvedilol 3.125 mg taking 1 twice a day 3.  START Nitroglycerin 0.4 s/l tablet.. USE AS DIRECTED   *If you need a refill on your cardiac medications before your next appointment, please call your pharmacy*   Lab Work: TODAY:  VITAMIN B12  04/06/20:  COME TO THE OFFICE FOR FASTING CHOLESTEROL.  NOTHING TO EAT OR DRINK AFTER MIDNIGHT THE NIGHT BEFORE   If you have labs (blood work) drawn today and your tests are completely normal, you will receive your results only by: Marland Kitchen MyChart Message (if you have MyChart) OR . A paper copy in the mail If you have any lab test that is abnormal or we need to change your treatment, we will call you to review the results.   Testing/Procedures: Your physician has requested that you have an echocardiogram. Echocardiography is a painless test that uses sound waves to create images of your heart. It provides your doctor with information about the size and shape of your heart and how well your heart's chambers and valves are working. This procedure takes approximately one hour. There are no restrictions for this procedure.   Your physician has requested that you have cardiac CT. Cardiac computed tomography (CT) is a painless test that uses an x-ray machine to take clear, detailed pictures of your heart. For further information please visit [email or web address] Please follow instruction sheet BELOW:  Your cardiac CT will be scheduled at one of the below locations:   [facility] [address] [place], [place] [address] [phone]  OR  [facility] [address] [place], [place] [address] [phone]  If scheduled at [facility], please arrive at the [facility] main entrance of [facility] 30 minutes prior to test start  time. Proceed to the [facility] Radiology Department (first floor) to check-in and test prep.   Please follow these instructions carefully (unless otherwise directed):  On the Night Before the Test: . Be sure to Drink plenty of water. . Do not consume any caffeinated/decaffeinated beverages or chocolate 12 hours prior to your test. . Do not take any antihistamines 12 hours prior to your test.  On the Day of the Test: . Drink plenty of water. Do not drink any water within one hour of the test. . Do not eat any food 4 hours prior to the test. . You may take your regular medications prior to the test.  . Take metoprolol (Lopressor) two hours prior to test. . FEMALES- please wear underwire-free bra if available      After the Test: . Drink plenty of water. . After receiving IV contrast, you may experience a mild flushed feeling. This is normal. . On occasion, you may experience a mild rash up to 24 hours after the test. This is not dangerous. If this occurs, you can take Benadryl 25 mg and increase your fluid intake. . If you experience trouble breathing, this can be serious. If it is severe call 911 IMMEDIATELY. If it is mild, please call our office. . If you take any of these medications: Glipizide/Metformin, Avandament, Glucavance, please do not take 48 hours after completing test unless otherwise instructed.   Once we have confirmed authorization from your insurance company, we will call you to set up a date and time for your test.  For non-scheduling related questions, please contact the cardiac imaging nurse navigator should you have any questions/concerns: Marchia Bond, RN Navigator Cardiac Imaging [facility] [phone] office  For scheduling needs, including cancellations and rescheduling, please call [phone].          Follow-Up: At [facility], you and your health needs are our priority.  As part of our continuing mission to  provide you with exceptional heart care, we have created designated Provider Care Teams.  These Care Teams include your primary Cardiologist (physician) and Advanced Practice Providers (APPs -  Physician Assistants and Nurse Practitioners) who all work together to provide you with the care you need, when you need it.  We recommend signing up for the patient portal called "MyChart".  Sign up information is provided on this After Visit Summary.  MyChart is used to connect with patients for Virtual Visits (Telemedicine).  Patients are able to view lab/test results, encounter notes, upcoming appointments, etc.  Non-urgent messages can be sent to your provider as well.   To learn more about what you can do with MyChart, go to [email or web address].    Your next appointment:   1 month(s)  The format for your next appointment:   In Person  Provider:   Berniece Salines, DO   Other Instructions

## 2020-02-24 LAB — VITAMIN B12: Vitamin B-12: 409 pg/mL (ref 232–1245)

## 2020-02-28 ENCOUNTER — Ambulatory Visit (HOSPITAL_BASED_OUTPATIENT_CLINIC_OR_DEPARTMENT_OTHER)
Admission: RE | Admit: 2020-02-28 | Discharge: 2020-02-28 | Disposition: A | Discharge status: Home / Self Care | Payer: 59 | Source: Ambulatory Visit | Attending: Cardiology | Admitting: Cardiology

## 2020-02-28 ENCOUNTER — Other Ambulatory Visit: Payer: Self-pay

## 2020-02-28 DIAGNOSIS — R5383 Other fatigue: Secondary | ICD-10-CM | POA: Diagnosis present

## 2020-02-28 DIAGNOSIS — I471 Supraventricular tachycardia: Secondary | ICD-10-CM | POA: Insufficient documentation

## 2020-02-28 DIAGNOSIS — R079 Chest pain, unspecified: Secondary | ICD-10-CM | POA: Diagnosis present

## 2020-02-28 NOTE — Progress Notes (Signed)
  Echocardiogram 2D Echocardiogram has been performed.  Sinda Du 02/28/2020, 1:43 PM

## 2020-02-29 ENCOUNTER — Telehealth: Payer: Self-pay | Admitting: *Deleted

## 2020-02-29 NOTE — Telephone Encounter (Signed)
Thank you for reassuring the patient about the trivial pericardial effusion.    Please let her know that I did ask her to get the coronary CTA which will also help [place] understand the chest heaviness and also if the shortness of breath is due to any ischemic/heart blockages in her coronary arteries.  I see that she has not schedule her coronary CTA.  Please encourage her to do so as this testing will give [place] a lot of information.  Also please advise the patient that if the symptoms get worse to go to the nearest emergency department.

## 2020-02-29 NOTE — Telephone Encounter (Signed)
Thank you :)

## 2020-02-29 NOTE — Telephone Encounter (Signed)
Pt has been advised of echo results by phone with verbal understanding. Pt states she saw on the echo that it said she has trivial pericardial effusion. I tried to assure the pt trivial amount of fluid is not typically something to be worried about though Dr. Servando Salina will discuss further at appt 03/29/20. Pt states she is still having symptoms she had when she saw Dr. Servando Salina, (SOB, chest heaviness). Pt denies chest pain, n&v, sweating, arm pain. Pt states nothing worse than when she saw Dr. Servando Salina recently. I assured the pt that I will send a message to Dr. Servando Salina and her nurse will call back with if any new recommendations per Dr. Servando Salina. Pt thanked me for the call. Patient notified of result.  Please refer to phone note from today for complete details.   Danielle Rankin, [facility] 02/29/2020 10:17 AM

## 2020-02-29 NOTE — Telephone Encounter (Signed)
-----   Message from Thomasene Ripple, DO sent at 02/29/2020  8:45 AM EDT ----- The echo showed that the heart is not fully relaxing like it should ( diastolic dysfunction) ,but otherwise normal. I will discuss it at the next office visit.

## 2020-02-29 NOTE — Telephone Encounter (Signed)
I s/w pt and went over recommendations per Dr. Servando Salina to proceed with Coronary CTA for further evaluation, Pt states she is planning on doing CT though states she is waiting to hear if it has been approved with her insurance carrier. I advised to reach out to Dr. Mallory Shirk office and see if they have received authorization from the insurance carrier. Pt has also been advised if her symptoms do worsen then she has been advised to proceed to the ED. Pt is thankful for the call and the help today. Pt is agreeable to plan of care.

## 2020-03-05 ENCOUNTER — Telehealth: Payer: Self-pay | Admitting: Cardiology

## 2020-03-05 ENCOUNTER — Other Ambulatory Visit: Payer: Self-pay

## 2020-03-05 MED ORDER — AMLODIPINE BESYLATE 5 MG PO TABS: 5.0000 mg | ORAL_TABLET | Freq: Every day | ORAL | 2 refills | Status: DC

## 2020-03-05 NOTE — Telephone Encounter (Signed)
New message  Pt c/o swelling: STAT is pt has developed SOB within 24 hours  1) How much weight have you gained and in what time span? N/a  2) If swelling, where is the swelling located? Swelling in the Feet and Ankles  3) Are you currently taking a fluid pill? No  4) Are you currently SOB? Yes, same as it was since last office visit  5) Do you have a log of your daily weights (if so, list)? n/a  6) Have you gained 3 pounds in a day or 5 pounds in a week? n/a  7) Have you traveled recently? Yes

## 2020-03-05 NOTE — Telephone Encounter (Signed)
Called patient. Scheduled her to see Dr. Servando Salina in [place] office next week. Informed her of this address.

## 2020-03-05 NOTE — Telephone Encounter (Signed)
Contacted patient, she states that she has new heart issues, and now she has had increased swelling the past few days in her feet- she states she was out of town this weekend, and the swelling goes down at night when she is laying down, but throughout the day the swelling goes back up- she states that she is unable to elevate her feet now because she is at work, I did advise once she got home to do this, as well as wear compression stockings as she states she does not wear them now.  Patient states she has had no diet changes, no increased salt.  Advised I would route a message to Dr.Tobb for recommendations, patient would like to know if something else is going on, or if medication would be an option.

## 2020-03-05 NOTE — Telephone Encounter (Signed)
Attempted to look at schedule for April for patient to be seen sooner- Dr.Tobb only in office at the end of April- around the time of original appointment.  Unsure how they handle this, will route to nurses in these offices to assist with this.  Thank you!

## 2020-03-05 NOTE — Telephone Encounter (Signed)
New Message   *STAT* If patient is at the pharmacy, call can be transferred to refill team.   1. Which medications need to be refilled? (please list name of each medication and dose if known) amLODipine (NORVASC) 5 MG tablet  2. Which pharmacy/location (including street and city if local pharmacy) is medication to be sent to? [id number] - [place], [place] - [address]  3. Do they need a 30 day or 90 day supply? 30

## 2020-03-05 NOTE — Telephone Encounter (Signed)
Please have her schedule sooner than 4/29 to have this addressed.

## 2020-03-06 ENCOUNTER — Other Ambulatory Visit: Payer: Self-pay

## 2020-03-06 ENCOUNTER — Emergency Department (HOSPITAL_BASED_OUTPATIENT_CLINIC_OR_DEPARTMENT_OTHER): Payer: 59

## 2020-03-06 ENCOUNTER — Encounter (HOSPITAL_BASED_OUTPATIENT_CLINIC_OR_DEPARTMENT_OTHER): Payer: Self-pay | Admitting: *Deleted

## 2020-03-06 ENCOUNTER — Telehealth: Payer: Self-pay | Admitting: *Deleted

## 2020-03-06 ENCOUNTER — Emergency Department (HOSPITAL_BASED_OUTPATIENT_CLINIC_OR_DEPARTMENT_OTHER)
Admission: EM | Admit: 2020-03-06 | Discharge: 2020-03-06 | Disposition: A | Discharge status: Home / Self Care | Payer: 59 | Attending: Emergency Medicine | Admitting: Emergency Medicine

## 2020-03-06 DIAGNOSIS — Z79899 Other long term (current) drug therapy: Secondary | ICD-10-CM | POA: Insufficient documentation

## 2020-03-06 DIAGNOSIS — R079 Chest pain, unspecified: Secondary | ICD-10-CM | POA: Insufficient documentation

## 2020-03-06 DIAGNOSIS — I10 Essential (primary) hypertension: Secondary | ICD-10-CM | POA: Insufficient documentation

## 2020-03-06 LAB — BASIC METABOLIC PANEL
Anion gap: 13 (ref 5–15)
BUN: 19 mg/dL (ref 6–20)
CO2: 21 mmol/L — ABNORMAL LOW (ref 22–32)
Calcium: 9.6 mg/dL (ref 8.9–10.3)
Chloride: 103 mmol/L (ref 98–111)
Creatinine, Ser: 0.9 mg/dL (ref 0.44–1.00)
GFR calc Af Amer: >60 mL/min (ref >60)
GFR calc non Af Amer: >60 mL/min (ref >60)
Glucose, Bld: 108 mg/dL — ABNORMAL HIGH (ref 70–99)
Potassium: 3.7 mmol/L (ref 3.5–5.1)
Sodium: 137 mmol/L (ref 135–145)

## 2020-03-06 LAB — CBC
HCT: 42 % (ref 36.0–46.0)
Hemoglobin: 14.3 g/dL (ref 12.0–15.0)
MCH: 32.2 pg (ref 26.0–34.0)
MCHC: 34 g/dL (ref 30.0–36.0)
MCV: 94.6 fL (ref 80.0–100.0)
Platelets: 231 10*3/uL (ref 150–400)
RBC: 4.44 MIL/uL (ref 3.87–5.11)
RDW: 13.5 % (ref 11.5–15.5)
WBC: 9.9 10*3/uL (ref 4.0–10.5)
nRBC: 0 % (ref 0.0–0.2)

## 2020-03-06 LAB — TROPONIN I (HIGH SENSITIVITY): Troponin I (High Sensitivity): <2 ng/L (ref <18)

## 2020-03-06 LAB — D-DIMER, QUANTITATIVE: D-Dimer, Quant: 0.49 ug/mL-FEU (ref 0.00–0.50)

## 2020-03-06 LAB — BRAIN NATRIURETIC PEPTIDE: B Natriuretic Peptide: 52.6 pg/mL (ref 0.0–100.0)

## 2020-03-06 MED ORDER — SODIUM CHLORIDE 0.9% FLUSH
3.0000 mL | Freq: Once | INTRAVENOUS | Status: DC
  Filled 2020-03-06: qty 3

## 2020-03-06 NOTE — Telephone Encounter (Signed)
MyChart message received: Julia Mckinney, [place] Triage  Phone Number: [phone]  Good morning, I'm not sure if this can wait until my appointment with you on 4/13, but I was feeling really bad this morning - chest heaviness and shortness of breath again with some lightheadedness and tingling in my fingers - general feeling like I was going to pass out. I ended up taking 2 nitroglycerin and it did help. I don't feel as bad as I did but not completely normal either. My blood pressures have been running in the 138/90 range with the medications, but I didn't take it this morning. The swelling yesterday was also in my calves but it is better today after elevating my feet after work yesterday. I am at work and can't elevate them here so hopefully I won't have that problem again today.

## 2020-03-06 NOTE — Discharge Instructions (Signed)
Please schedule follow-up appointment regarding your symptoms you are experiencing today with your cardiologist.  Please obtain the outpatient cardiac work-up that was previously discussed.  Return to ER if you develop worsening chest pain, difficulty breathing, other new concerning symptoms.

## 2020-03-06 NOTE — Telephone Encounter (Signed)
Called patient in response to MyChart message below. Her symptoms are not new.  They are what took her to ER in March.  Had visit with Dr. Servando Salina.  Echo completed.  Has cardiac ct this Friday and follow up with Dr. Servando Salina 03/13/20.  This am chest pressure and SOB were as bad as the day she went to ER.  Also had head pressure. Had tingling in her hands and fingers.  Felt like might pass out.  Took 2 ntg.  Symptoms improved.  Drove herself to work today.  SOB on phone.   Swelling daily, improves by AM.    Symptoms do not occur with any specific activity. At beach walking this weekend, no symptoms.  Is limiting sodium.  Has not gotten compression stockings yet.  Pt aware to return to ED for symptoms that worsen or do not improve. Aware I am forwarding to Dr. Servando Salina and will call her back if there are new recommendations.

## 2020-03-06 NOTE — ED Triage Notes (Signed)
Sob and chest pain since this am.

## 2020-03-07 NOTE — ED Provider Notes (Signed)
[facility] [place] EMERGENCY DEPARTMENT Provider Note   CSN: [id number] Arrival date & time: 03/06/20  1237     History Chief Complaint  Patient presents with  . Chest Pain  . Shortness of Breath    Julia Mckinney is a 59 y.o. female.  Presented to ER with chief complaint chest pain, shortness of breath.  Has been having intermittent symptoms over the past month.  Symptoms today started this morning when she woke up.  Has been having chest pressure sensation, nonradiating, mild to moderate in severity.  Relatively constant.  Has seemed to ease off though.  Not associated with exertion. Also has had some mild shortness of breath associated with this.  Worse with deep breath.  Seen by cardiology.  Echocardiogram recently completed, EF normal.  No past history of diabetes, hyperlipidemia, hypertension, CAD, nor DVT/PE.   Complete additional chart review, reviewed cardiology notes, recent ER visit note.  HPI     Past Medical History:  Diagnosis Date  . Astigmatism of left eye 01/09/2012  . Bilateral dry eyes 01/09/2012  . Collapsed lung   . Endometriosis   . Family history of breast cancer 02/03/2019  . Generalized anxiety disorder 09/30/2019  . History of hysterectomy 02/22/2020  . Keratitis 08/29/2011   Formatting of this note might be different from the original. HSV  . Keratitis, herpetic   . 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 Oral aphthae 05/08/2013  . PVD (posterior vitreous detachment), both eyes 05/11/2015  . 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 SVT (supraventricular tachycardia) (HCC)   . Vitamin D deficiency 10/10/2019    Patient Active Problem List   Diagnosis Date Noted  . Chest pain 02/23/2020  . Fatigue 02/23/2020  . Dyspnea on exertion 02/23/2020  . Essential hypertension 02/23/2020  . Mixed hyperlipidemia 02/23/2020  . Obesity (BMI 30-39.9) 02/23/2020  . History of hysterectomy 02/22/2020  . Vitamin D  deficiency 10/10/2019  . Generalized anxiety disorder 09/30/2019  . Family history of breast cancer 02/03/2019  . 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 PVD (posterior vitreous detachment), both eyes 05/11/2015  . SVT (supraventricular tachycardia) (HCC) 12/13/2013  . Oral aphthae 05/08/2013  . Astigmatism of left eye 01/09/2012  . Bilateral dry eyes 01/09/2012  . Keratitis, herpetic 09/04/2011  . Keratitis 08/29/2011    Past Surgical History:  Procedure Laterality Date  . ABDOMINAL HYSTERECTOMY    . PARTIAL HYSTERECTOMY       OB History   No obstetric history on file.     Family History  Problem Relation Age of Onset  . Heart attack Father 37  . Heart attack Maternal Grandmother   . Heart attack Maternal Grandfather   . Atrial fibrillation Mother   . Hypertension Mother   . Asthma Mother   . Diabetes Mother   . Breast cancer Sister   . Diabetes Sister   . Diabetes Sister     Social History   Tobacco Use  . Smoking status: Never Smoker  . Smokeless tobacco: Never Used  Substance Use Topics  . Alcohol use: No  . Drug use: No    Home Medications Prior to Admission medications   Medication Sig Start Date End Date Taking? Authorizing Provider  amLODipine (NORVASC) 5 MG tablet Take 1 tablet (5 mg total) by mouth daily. 03/05/20  Yes Tobb, Kardie, DO  carvedilol (COREG) 3.125  MG tablet Take 1 tablet (3.125 mg total) by mouth 2 (two) times daily. 02/23/20 05/23/20 Yes Tobb, Kardie, DO  Multiple Vitamins-Minerals (MULTIVITAMIN WITH MINERALS) tablet Take 1 tablet by mouth daily.   Yes [provider]  nitroGLYCERIN (NITROSTAT) 0.4 MG SL tablet Place 1 tablet (0.4 mg total) under the tongue every 5 (five) minutes as needed. 02/23/20 05/23/20 Yes Tobb, Kardie, DO  rosuvastatin (CRESTOR) 10 MG tablet Take 1 tablet (10 mg total) by mouth daily. 02/23/20 05/23/20 Yes Tobb, Kardie, DO    valACYclovir (VALTREX) 1000 MG tablet Take 1,000 mg by mouth daily.    Yes [provider]  Ergocalciferol (VITAMIN D2) 10 MCG (400 UNIT) TABS SMARTSIG:1 Capsule(s) By Mouth Once a Week 10/14/19   [provider]  metoprolol tartrate (LOPRESSOR) 100 MG tablet TAKE 1 TABLET BY MOUTH 2 HOURS PRIOR TO YOUR CARDIAC CT 02/23/20   Tobb, Kardie, DO  moxifloxacin (VIGAMOX) 0.5 % ophthalmic solution Place 1 drop into the right eye 4 (four) times daily. 11/01/19   [provider]  Omega-3 Fatty Acids (FISH OIL) 1000 MG CAPS Take 1,000 mg by mouth daily.    [provider]    Allergies    Amoxicillin, Azithromycin, and Other  Review of Systems   Review of Systems  Constitutional: Negative for chills and fever.  HENT: Negative for ear pain and sore throat.   Eyes: Negative for pain and visual disturbance.  Respiratory: Positive for shortness of breath. Negative for cough.   Cardiovascular: Positive for chest pain. Negative for palpitations.  Gastrointestinal: Negative for abdominal pain and vomiting.  Genitourinary: Negative for dysuria and hematuria.  Musculoskeletal: Negative for arthralgias and back pain.  Skin: Negative for color change and rash.  Neurological: Negative for seizures and syncope.  All other systems reviewed and are negative.   Physical Exam Updated Vital Signs BP 121/61   Pulse 76   Temp 98.2 F (36.8 C) (Oral)   Resp (!) 21   Ht 5\' 5"  (1.651 m)   Wt 92.5 kg   SpO2 96%   BMI 33.93 kg/m   Physical Exam Vitals and nursing note reviewed.  Constitutional:      General: She is not in acute distress.    Appearance: She is well-developed.  HENT:     Head: Normocephalic and atraumatic.  Eyes:     Conjunctiva/sclera: Conjunctivae normal.  Cardiovascular:     Rate and Rhythm: Normal rate and regular rhythm.     Heart sounds: No murmur.  Pulmonary:     Effort: Pulmonary effort is normal. No respiratory distress.     Breath sounds:  Normal breath sounds.  Abdominal:     Palpations: Abdomen is soft.     Tenderness: There is no abdominal tenderness.  Musculoskeletal:     Cervical back: Neck supple.     Right lower leg: No tenderness. No edema.     Left lower leg: No tenderness. No edema.  Skin:    General: Skin is warm and dry.     Capillary Refill: Capillary refill takes less than 2 seconds.  Neurological:     General: No focal deficit present.     Mental Status: She is alert and oriented to person, place, and time.     ED Results / Procedures / Treatments   Labs (all labs ordered are listed, but only abnormal results are displayed) Labs Reviewed  BASIC METABOLIC PANEL - Abnormal; Notable for the following components:      Result  Value   CO2 21 (*)    Glucose, Bld 108 (*)    All other components within normal limits  CBC  D-DIMER, QUANTITATIVE (NOT AT [facility])  BRAIN NATRIURETIC PEPTIDE  TROPONIN I (HIGH SENSITIVITY)    EKG EKG Interpretation  Date/Time:  Tuesday March 06 2020 12:43:20 EDT Ventricular Rate:  72 PR Interval:  144 QRS Duration: 98 QT Interval:  396 QTC Calculation: 433 R Axis:   57 Text Interpretation: Normal sinus rhythm with sinus arrhythmia Normal ECG Confirmed by Marianna Fuss ([id number]) on 03/06/2020 12:51:52 PM   Radiology DG Chest 2 View  Result Date: 03/06/2020 CLINICAL DATA:  Chest pressure and shortness of breath. EXAM: CHEST - 2 VIEW COMPARISON:  Two-view chest x-ray 02/08/2020 FINDINGS: The heart size and mediastinal contours are within normal limits. Both lungs are clear. The visualized skeletal structures are unremarkable. IMPRESSION: Negative two view chest x-ray Electronically Signed   By: Marin Roberts M.D.   On: 03/06/2020 13:16    Procedures Procedures (including critical care time)  Medications Ordered in ED Medications - No data to display  ED Course  I have reviewed the triage vital signs and the nursing notes.  Pertinent labs & imaging results that  were available during my care of the patient were reviewed by me and considered in my medical decision making (see chart for details).    MDM Rules/Calculators/A&P                     Raff 59 year old lady presented to ER with complaints of chest pain shortness of breath.  On exam she is remarkably well-appearing, normal vital signs.  Symptoms had resolved in ER.  EKG without ischemic changes, troponin undetectable.  Given these findings, I have a very low suspicion for ACS.  D-dimer was negative, doubt PE.  CXR negative for pneumonia, PTX.  Patient has cardiology follow-up already, planning to have CT coronary study performed on Friday.  Given work-up today, believe patient is stable and appropriate for outpatient management at this time.  Recommended following up with cardiology as well as primary doctor and obtaining the CT coronary study as previously scheduled.    After the discussed management above, the patient was determined to be safe for discharge.  The patient was in agreement with this plan and all questions regarding their care were answered.  ED return precautions were discussed and the patient will return to the ED with any significant worsening of condition.     Final Clinical Impression(s) / ED Diagnoses Final diagnoses:  Chest pain, unspecified type    Rx / DC Orders ED Discharge Orders    None       Milagros Loll, MD 03/08/20 [phone]

## 2020-03-08 ENCOUNTER — Encounter (HOSPITAL_COMMUNITY): Payer: Self-pay

## 2020-03-08 ENCOUNTER — Telehealth (HOSPITAL_COMMUNITY): Payer: Self-pay | Admitting: Emergency Medicine

## 2020-03-08 NOTE — Telephone Encounter (Signed)
Reaching out to patient to offer assistance regarding upcoming cardiac imaging study; pt verbalizes understanding of appt date/time, parking situation and where to check in, pre-test NPO status and medications ordered, and verified current allergies; name and call back number provided for further questions should they arise Jeremaine Maraj RN Navigator Cardiac Imaging [facility] [phone] office [phone] cell 

## 2020-03-09 ENCOUNTER — Encounter (HOSPITAL_COMMUNITY): Payer: Self-pay

## 2020-03-09 ENCOUNTER — Ambulatory Visit (HOSPITAL_COMMUNITY)
Admission: RE | Admit: 2020-03-09 | Discharge: 2020-03-09 | Disposition: A | Discharge status: Home / Self Care | Payer: 59 | Source: Ambulatory Visit | Attending: Cardiology | Admitting: Cardiology

## 2020-03-09 ENCOUNTER — Other Ambulatory Visit: Payer: Self-pay

## 2020-03-09 DIAGNOSIS — R079 Chest pain, unspecified: Secondary | ICD-10-CM

## 2020-03-09 MED ORDER — NITROGLYCERIN 0.4 MG SL SUBL: 0.8000 mg | SUBLINGUAL_TABLET | Freq: Once | SUBLINGUAL | Status: DC

## 2020-03-09 NOTE — Progress Notes (Addendum)
Arrived to IR: pt canceled procedure. Cancel consult.

## 2020-03-09 NOTE — Progress Notes (Signed)
Patient here for CT scan of heart. Patient anxious on arrival due to claustrophobia and having to lay in CT machine. Patient also states she has been experiencing shortness of breath and cough for one month, denies fever. She states she has been tested for COVID this week which was negative. Patient informed of steps of CT scan to help alleviate anxiety, however patient states with her shortness of breath and claustrophobia it will be "near impossible" for her to lay still. CT heart nurse Rockwell Alexandria to be informed of cancellation of exam.

## 2020-03-13 ENCOUNTER — Ambulatory Visit: Payer: 59 | Admitting: Cardiology

## 2020-03-29 ENCOUNTER — Ambulatory Visit: Payer: 59 | Admitting: Cardiology

## 2020-03-30 ENCOUNTER — Ambulatory Visit: Payer: 59 | Admitting: Cardiology

## 2020-04-06 ENCOUNTER — Other Ambulatory Visit: Payer: Self-pay

## 2020-04-06 DIAGNOSIS — I471 Supraventricular tachycardia: Secondary | ICD-10-CM

## 2020-04-06 DIAGNOSIS — R5383 Other fatigue: Secondary | ICD-10-CM

## 2020-04-06 DIAGNOSIS — R079 Chest pain, unspecified: Secondary | ICD-10-CM

## 2020-04-07 LAB — LIPID PANEL
Chol/HDL Ratio: 2.5 ratio (ref 0.0–4.4)
Cholesterol, Total: 145 mg/dL (ref 100–199)
HDL: 58 mg/dL (ref >39)
LDL Chol Calc (NIH): 71 mg/dL (ref 0–99)
Triglycerides: 84 mg/dL (ref 0–149)
VLDL Cholesterol Cal: 16 mg/dL (ref 5–40)

## 2020-04-09 ENCOUNTER — Telehealth: Payer: Self-pay

## 2020-04-09 NOTE — Telephone Encounter (Signed)
Left message on patients voicemail to please return our call.   

## 2020-04-09 NOTE — Telephone Encounter (Signed)
Spoke with patient regarding results and recommendation.  Patient verbalizes understanding and is agreeable to plan of care. Advised patient to call back with any issues or concerns.  

## 2020-04-09 NOTE — Telephone Encounter (Signed)
-----   Message from Thomasene Ripple, DO sent at 04/07/2020  2:20 PM EDT ----- Good results in your lipid profile we will not change any medications at this time.

## 2020-04-09 NOTE — Telephone Encounter (Signed)
Patient returning call. Transferred call to the nurse. 

## 2020-04-15 ENCOUNTER — Other Ambulatory Visit: Payer: Self-pay | Admitting: Cardiology

## 2020-04-25 ENCOUNTER — Other Ambulatory Visit: Payer: Self-pay

## 2020-04-25 ENCOUNTER — Other Ambulatory Visit: Payer: Self-pay | Admitting: *Deleted

## 2020-04-25 ENCOUNTER — Ambulatory Visit (INDEPENDENT_AMBULATORY_CARE_PROVIDER_SITE_OTHER): Payer: 59 | Admitting: Cardiology

## 2020-04-25 ENCOUNTER — Encounter: Payer: Self-pay | Admitting: Cardiology

## 2020-04-25 VITALS — BP 150/90 | HR 85 | Ht 65.0 in | Wt 206.4 lb

## 2020-04-25 DIAGNOSIS — R079 Chest pain, unspecified: Secondary | ICD-10-CM | POA: Diagnosis not present

## 2020-04-25 DIAGNOSIS — E669 Obesity, unspecified: Secondary | ICD-10-CM

## 2020-04-25 DIAGNOSIS — E782 Mixed hyperlipidemia: Secondary | ICD-10-CM

## 2020-04-25 DIAGNOSIS — I10 Essential (primary) hypertension: Secondary | ICD-10-CM

## 2020-04-25 DIAGNOSIS — I471 Supraventricular tachycardia: Secondary | ICD-10-CM

## 2020-04-25 MED ORDER — CARVEDILOL 6.25 MG PO TABS: 6.2500 mg | ORAL_TABLET | Freq: Two times a day (BID) | ORAL | 3 refills | Status: DC

## 2020-04-25 MED ORDER — METOPROLOL TARTRATE 100 MG PO TABS: ORAL_TABLET | ORAL | 0 refills | Status: DC

## 2020-04-25 NOTE — Patient Instructions (Addendum)
Medication Instructions:  Your physician has recommended you make the following change in your medication:  1.  INCREASE the Carvedilol to 6.25 mg twice a day.  You may take 2 of the 3.125 mg tablets twice a day to use them up   *If you need a refill on your cardiac medications before your next appointment, please call your pharmacy*   Lab Work: When you have the CT:  BMET   If you have labs (blood work) drawn today and your tests are completely normal, you will receive your results only by: Marland Kitchen MyChart Message (if you have MyChart) OR . A paper copy in the mail If you have any lab test that is abnormal or we need to change your treatment, we will call you to review the results.   Testing/Procedures: Your physician has requested that you have cardiac CT. Cardiac computed tomography (CT) is a painless test that uses an x-ray machine to take clear, detailed pictures of your heart. For further information please visit [email or web address]. Please follow instruction sheet as given.   Your cardiac CT will be scheduled at the below locations:   [facility] [address] [place], [place] [address] [phone]   If scheduled at [facility], please arrive at the [facility] main entrance of [facility] 30 minutes prior to test start time. Proceed to the [facility] Radiology Department (first floor) to check-in and test prep.   Please follow these instructions carefully (unless otherwise directed):   On the Night Before the Test:  Be sure to Drink plenty of water.  Do not consume any caffeinated/decaffeinated beverages or chocolate 12 hours prior to your test.  Do not take any antihistamines 12 hours prior to your test.  On the Day of the Test:  Drink plenty of water. Do not drink any water within one hour of the test.  Do not eat any food 4 hours prior to the test.  You may take your regular medications prior to the test.   Take  metoprolol (Lopressor) 100mg  two hours prior to test.  FEMALES- please wear underwire-free bra if available        After the Test:  Drink plenty of water.  After receiving IV contrast, you may experience a mild flushed feeling. This is normal.  On occasion, you may experience a mild rash up to 24 hours after the test. This is not dangerous. If this occurs, you can take Benadryl 25 mg and increase your fluid intake.  If you experience trouble breathing, this can be serious. If it is severe call 911 IMMEDIATELY. If it is mild, please call our office.   Please contact the cardiac imaging nurse navigator should you have any questions/concerns: Marchia Bond, RN Navigator Cardiac Imaging [place] [facility] [phone] office  For scheduling needs, including cancellations and rescheduling, please call [phone].       Follow-Up: At [facility], you and your health needs are our priority.  As part of our continuing mission to provide you with exceptional heart care, we have created designated Provider Care Teams.  These Care Teams include your primary Cardiologist (physician) and Advanced Practice Providers (APPs -  Physician Assistants and Nurse Practitioners) who all work together to provide you with the care you need, when you need it.  We recommend signing up for the patient portal called "MyChart".  Sign up information is provided on this After Visit Summary.  MyChart is used to connect with  patients for Virtual Visits (Telemedicine).  Patients are able to view lab/test results, encounter notes, upcoming appointments, etc.  Non-urgent messages can be sent to your provider as well.   To learn more about what you can do with MyChart, go to [email or web address].    Your next appointment:   3 month(s)  The format for your next appointment:   In Person  Provider:   Thomasene Ripple, DO   Other Instructions

## 2020-04-25 NOTE — Progress Notes (Signed)
Cardiology Office Note:    Date:  04/25/2020   ID:  Julia Mckinney, DOB 12-09-1960, MRN [id number]  PCP:  Wynn Banker, PA  Cardiologist:  Berniece Salines, DO  Electrophysiologist:  None   Referring MD: Wynn Banker*   " I feel a lot better"  History of Present Illness:    Julia Mckinney is a 60 y.o. female with a hx of paroxysmal SVT, hypertension, hyperlipidemia, premature family history of coronary artery disease. Did see the patient in March 2021 at which time she did report midsternal chest tightness and heaviness. She did have some associated shortness of breath. At the conclusion of her visit she was hypertensive therefore I added carvedilol 3.125 mg to her medication regimen. In addition I recommended she undergo a coronary CTA given her significant risk factor for coronary artery disease and her symptoms.  In the interim the patient went to get the CTA done but she was able to tolerate this testing as she does feel sick she did not even start the test due to her feeling sick.  Today she tells me that the chest pain has resolved along with her shortness of breath and she feels better. No other complaints today.  Past Medical History:  Diagnosis Date  . Astigmatism of left eye 01/09/2012  . Bilateral dry eyes 01/09/2012  . Collapsed lung   . Endometriosis   . Family history of breast cancer 02/03/2019  . Generalized anxiety disorder 09/30/2019  . History of hysterectomy 02/22/2020  . Keratitis 08/29/2011   Formatting of this note might be different from the original. HSV  . Keratitis, herpetic   . 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 Oral aphthae 05/08/2013  . PVD (posterior vitreous detachment), both eyes 05/11/2015  . 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 SVT (supraventricular tachycardia) ([place])   . Vitamin D deficiency 10/10/2019    Past Surgical History:  Procedure Laterality Date  . ABDOMINAL HYSTERECTOMY     . PARTIAL HYSTERECTOMY      Current Medications: Current Meds  Medication Sig  . amLODipine (NORVASC) 5 MG tablet Take 1 tablet (5 mg total) by mouth daily.  . Ergocalciferol (VITAMIN D2) 10 MCG (400 UNIT) TABS SMARTSIG:1 Capsule(s) By Mouth Once a Week  . moxifloxacin (VIGAMOX) 0.5 % ophthalmic solution Place 1 drop into the right eye 4 (four) times daily.  . Multiple Vitamins-Minerals (MULTIVITAMIN WITH MINERALS) tablet Take 1 tablet by mouth daily.  . nitroGLYCERIN (NITROSTAT) 0.4 MG SL tablet Place 1 tablet (0.4 mg total) under the tongue every 5 (five) minutes as needed.  . Omega-3 Fatty Acids (FISH OIL) 1000 MG CAPS Take 1,000 mg by mouth daily.  . rosuvastatin (CRESTOR) 10 MG tablet Take 1 tablet (10 mg total) by mouth daily.  . valACYclovir (VALTREX) 1000 MG tablet Take 1,000 mg by mouth daily.   . [DISCONTINUED] carvedilol (COREG) 3.125 MG tablet Take 1 tablet by mouth twice daily     Allergies:   Amoxicillin, Azithromycin, and Other   Social History   Socioeconomic History  . Marital status: Legally Separated    Spouse name: Not on file  . Number of children: 2  . Years of education: Not on file  . Highest education level: Not on file  Occupational History  . Occupation: Office manager: Writer  Tobacco Use  . Smoking status: Never Smoker  . Smokeless tobacco: Never Used  Substance and  Sexual Activity  . Alcohol use: No  . Drug use: No  . Sexual activity: Not on file  Other Topics Concern  . Not on file  Social History Narrative  . Not on file   Social Determinants of Health   Financial Resource Strain:   . Difficulty of Paying Living Expenses:   Food Insecurity:   . Worried About Programme researcher, broadcasting/film/video in the Last Year:   . Barista in the Last Year:   Transportation Needs:   . Freight forwarder (Medical):   Marland Kitchen Lack of Transportation (Non-Medical):   Physical Activity:   . Days of Exercise per Week:   . Minutes of  Exercise per Session:   Stress:   . Feeling of Stress :   Social Connections:   . Frequency of Communication with Friends and Family:   . Frequency of Social Gatherings with Friends and Family:   . Attends Religious Services:   . Active Member of Clubs or Organizations:   . Attends Banker Meetings:   Marland Kitchen Marital Status:      Family History: The patient's family history includes Asthma in her mother; Atrial fibrillation in her mother; Breast cancer in her sister; Diabetes in her mother, sister, and sister; Heart attack in her maternal grandfather and maternal grandmother; Heart attack (age of onset: 4) in her father; Hypertension in her mother.  ROS:   Review of Systems  Constitution: Negative for decreased appetite, fever and weight gain.  HENT: Negative for congestion, ear discharge, hoarse voice and sore throat.   Eyes: Negative for discharge, redness, vision loss in right eye and visual halos.  Cardiovascular: Negative for chest pain, dyspnea on exertion, leg swelling, orthopnea and palpitations.  Respiratory: Negative for cough, hemoptysis, shortness of breath and snoring.   Endocrine: Negative for heat intolerance and polyphagia.  Hematologic/Lymphatic: Negative for bleeding problem. Does not bruise/bleed easily.  Skin: Negative for flushing, nail changes, rash and suspicious lesions.  Musculoskeletal: Negative for arthritis, joint pain, muscle cramps, myalgias, neck pain and stiffness.  Gastrointestinal: Negative for abdominal pain, bowel incontinence, diarrhea and excessive appetite.  Genitourinary: Negative for decreased libido, genital sores and incomplete emptying.  Neurological: Negative for brief paralysis, focal weakness, headaches and loss of balance.  Psychiatric/Behavioral: Negative for altered mental status, depression and suicidal ideas.  Allergic/Immunologic: Negative for HIV exposure and persistent infections.    EKGs/Labs/Other Studies Reviewed:     The following studies were reviewed today:   EKG: None today   TTE impression 02/28/2020 1. Left ventricular ejection fraction, by estimation, is 60 to 65%. The  left ventricle has normal function. The left ventricle has no regional  wall motion abnormalities. Left ventricular diastolic parameters are  consistent with Grade I diastolic  dysfunction (impaired relaxation).  2. Right ventricular systolic function is normal. The right ventricular  size is normal. There is normal pulmonary artery systolic pressure.  3. The mitral valve is normal in structure. Trivial mitral valve  regurgitation. No evidence of mitral stenosis.  4. The aortic valve is tricuspid. Aortic valve regurgitation is trivial.  No aortic stenosis is present.  5. The inferior vena cava is normal in size with greater than 50%  respiratory variability, suggesting right atrial pressure of 3 mmHg.  Recent Labs: 03/06/2020: B Natriuretic Peptide 52.6; BUN 19; Creatinine, Ser 0.90; Hemoglobin 14.3; Platelets 231; Potassium 3.7; Sodium 137  Recent Lipid Panel    Component Value Date/Time   CHOL 145 04/06/2020 0824  TRIG 84 04/06/2020 0824   HDL 58 04/06/2020 0824   CHOLHDL 2.5 04/06/2020 0824   LDLCALC 71 04/06/2020 0824    Physical Exam:    VS:  BP (!) 150/90   Pulse 85   Ht 5\' 5"  (1.651 m)   Wt 206 lb 6.4 oz (93.6 kg)   SpO2 99%   BMI 34.35 kg/m     Wt Readings from Last 3 Encounters:  04/25/20 206 lb 6.4 oz (93.6 kg)  03/06/20 203 lb 14.8 oz (92.5 kg)  02/23/20 204 lb (92.5 kg)     GEN: Well nourished, well developed in no acute distress HEENT: Normal NECK: No JVD; No carotid bruits LYMPHATICS: No lymphadenopathy CARDIAC: S1S2 noted,RRR, no murmurs, rubs, gallops RESPIRATORY:  Clear to auscultation without rales, wheezing or rhonchi  ABDOMEN: Soft, non-tender, non-distended, +bowel sounds, no guarding. EXTREMITIES: No edema, No cyanosis, no clubbing MUSCULOSKELETAL:  No deformity  SKIN:  Warm and dry NEUROLOGIC:  Alert and oriented x 3, non-focal PSYCHIATRIC:  Normal affect, good insight  ASSESSMENT:    1. Chest pain, unspecified type   2. SVT (supraventricular tachycardia) (HCC)   3. Essential hypertension   4. Mixed hyperlipidemia   5. Obesity (BMI 30-39.9)    PLAN:    1. According to the patient clinically the chest pain has resolved. But still maintained that I am concerned about this patient given her significant risk factor for coronary artery disease and her recent symptoms. I still do believe we need to evaluate this patient with an ischemic evaluation. She is still willing to undergo a coronary CTA therefore we will have the patient reschedule for this testing.  2. Her blood pressure is elevated in the office today I am going to increase her carvedilol from 3.125 mg twice a day to 6.25 mg twice a day. Hoping this can bring us to a target of less than 130/80 mmHg.  3. I was able to go over her echo results with her son which only does show evidence of diastolic dysfunction  4. In terms of her hyperlipidemia she will continue on her Crestor 10 mg daily for now.  5. Obesity-the patient understands the need to lose weight with diet and exercise. We have discussed specific strategies for this.  The patient is in agreement with the above plan. The patient left the office in stable condition.  The patient will follow up in 3 months or sooner if needed   Medication Adjustments/Labs and Tests Ordered: Current medicines are reviewed at length with the patient today.  Concerns regarding medicines are outlined above.  Orders Placed This Encounter  Procedures  . Basic metabolic panel   Meds ordered this encounter  Medications  . metoprolol tartrate (LOPRESSOR) 100 MG tablet    Sig: TAKE 1 TABLET BY MOUTH 2 HOURS PRIOR TO YOUR CARDIAC CT    Dispense:  1 tablet    Refill:  0  . carvedilol (COREG) 6.25 MG tablet    Sig: Take 1 tablet (6.25 mg total) by mouth 2 (two)  times daily.    Dispense:  180 tablet    Refill:  3    Patient Instructions  Medication Instructions:  Your physician has recommended you make the following change in your medication:  1.  INCREASE the Carvedilol to 6.25 mg twice a day.  You may take 2 of the 3.125 mg tablets twice a day to use them up   *If you need a refill on your cardiac medications before your  next appointment, please call your pharmacy*   Lab Work: When you have the CT:  BMET   If you have labs (blood work) drawn today and your tests are completely normal, you will receive your results only by: Marland Kitchen MyChart Message (if you have MyChart) OR . A paper copy in the mail If you have any lab test that is abnormal or we need to change your treatment, we will call you to review the results.   Testing/Procedures: Your physician has requested that you have cardiac CT. Cardiac computed tomography (CT) is a painless test that uses an x-ray machine to take clear, detailed pictures of your heart. For further information please visit [email or web address] Please follow instruction sheet as given.   Your cardiac CT will be scheduled at the below locations:   [facility] [address] [place], [place] [address] [phone]   If scheduled at [facility], please arrive at the [facility] main entrance of [facility] 30 minutes prior to test start time. Proceed to the [facility] Radiology Department (first floor) to check-in and test prep.   Please follow these instructions carefully (unless otherwise directed):   On the Night Before the Test:  Be sure to Drink plenty of water.  Do not consume any caffeinated/decaffeinated beverages or chocolate 12 hours prior to your test.  Do not take any antihistamines 12 hours prior to your test.  On the Day of the Test:  Drink plenty of water. Do not drink any water within one hour of the test.  Do not eat any food 4 hours prior to the  test.  You may take your regular medications prior to the test.   Take metoprolol (Lopressor) 100mg  two hours prior to test.  FEMALES- please wear underwire-free bra if available        After the Test:  Drink plenty of water.  After receiving IV contrast, you may experience a mild flushed feeling. This is normal.  On occasion, you may experience a mild rash up to 24 hours after the test. This is not dangerous. If this occurs, you can take Benadryl 25 mg and increase your fluid intake.  If you experience trouble breathing, this can be serious. If it is severe call 911 IMMEDIATELY. If it is mild, please call our office.   Please contact the cardiac imaging nurse navigator should you have any questions/concerns: , RN Navigator Cardiac Imaging [place] [facility] [phone] office  For scheduling needs, including cancellations and rescheduling, please call [phone].       Follow-Up: At [facility], you and your health needs are our priority.  As part of our continuing mission to provide you with exceptional heart care, we have created designated Provider Care Teams.  These Care Teams include your primary Cardiologist (physician) and Advanced Practice Providers (APPs -  Physician Assistants and Nurse Practitioners) who all work together to provide you with the care you need, when you need it.  We recommend signing up for the patient portal called "MyChart".  Sign up information is provided on this After Visit Summary.  MyChart is used to connect with patients for Virtual Visits (Telemedicine).  Patients are able to view lab/test results, encounter notes, upcoming appointments, etc.  Non-urgent messages can be sent to your provider as well.   To learn more about what you can do with MyChart, go to [facility].    Your next appointment:   3 month(s)  The format  for your next appointment:   In Person  Provider:   Thomasene Ripple, DO   Other Instructions     Adopting a Healthy Lifestyle.  Know what a healthy weight is for you (roughly BMI <25) and aim to maintain this   Aim for 7+ servings of fruits and vegetables daily   65-80+ fluid ounces of water or unsweet tea for healthy kidneys   Limit to max 1 drink of alcohol per day; avoid smoking/tobacco   Limit animal fats in diet for cholesterol and heart health - choose grass fed whenever available   Avoid highly processed foods, and foods high in saturated/trans fats   Aim for low stress - take time to unwind and care for your mental health   Aim for 150 min of moderate intensity exercise weekly for heart health, and weights twice weekly for bone health   Aim for 7-9 hours of sleep daily   When it comes to diets, agreement about the perfect plan isnt easy to find, even among the experts. Experts at the [facility] of [employer] developed an idea known as the Healthy Eating Plate. Just imagine a plate divided into logical, healthy portions.   The emphasis is on diet quality:   Load up on vegetables and fruits - one-half of your plate: Aim for color and variety, and remember that potatoes dont count.   Go for whole grains - one-quarter of your plate: Whole wheat, barley, wheat berries, quinoa, oats, brown rice, and foods made with them. If you want pasta, go with whole wheat pasta.   Protein power - one-quarter of your plate: Fish, chicken, beans, and nuts are all healthy, versatile protein sources. Limit red meat.   The diet, however, does go beyond the plate, offering a few other suggestions.   Use healthy plant oils, such as olive, canola, soy, corn, sunflower and peanut. Check the labels, and avoid partially hydrogenated oil, which have unhealthy trans fats.   If youre thirsty, drink water. Coffee and tea are good in moderation, but skip sugary drinks and limit milk and dairy products to one or two daily servings.   The type of  carbohydrate in the diet is more important than the amount. Some sources of carbohydrates, such as vegetables, fruits, whole grains, and beans-are healthier than others.   Finally, stay active  Signed, Thomasene Ripple, DO  04/25/2020 5:04 PM    [facility]

## 2020-04-26 NOTE — Addendum Note (Signed)
Addended by: Burnetta Sabin on: 04/26/2020 03:11 PM   Modules accepted: Orders

## 2020-05-23 ENCOUNTER — Telehealth (HOSPITAL_COMMUNITY): Payer: Self-pay | Admitting: *Deleted

## 2020-05-23 NOTE — Telephone Encounter (Signed)

## 2020-05-24 ENCOUNTER — Telehealth: Payer: Self-pay

## 2020-05-24 LAB — BASIC METABOLIC PANEL
BUN/Creatinine Ratio: 22 (ref 9–23)
BUN: 19 mg/dL (ref 6–24)
CO2: 25 mmol/L (ref 20–29)
Calcium: 9.7 mg/dL (ref 8.7–10.2)
Chloride: 104 mmol/L (ref 96–106)
Creatinine, Ser: 0.86 mg/dL (ref 0.57–1.00)
GFR calc Af Amer: 87 mL/min/{1.73_m2} (ref >59)
GFR calc non Af Amer: 75 mL/min/{1.73_m2} (ref >59)
Glucose: 92 mg/dL (ref 65–99)
Potassium: 4.8 mmol/L (ref 3.5–5.2)
Sodium: 142 mmol/L (ref 134–144)

## 2020-05-24 NOTE — Telephone Encounter (Signed)
Spoke with patient regarding results and recommendation.  Patient verbalizes understanding and is agreeable to plan of care. Advised patient to call back with any issues or concerns.  

## 2020-05-24 NOTE — Telephone Encounter (Signed)
-----   Message from Baldo Daub, MD sent at 05/24/2020  7:59 AM EDT ----- Normal or stable result  No changes

## 2020-05-25 ENCOUNTER — Other Ambulatory Visit: Payer: Self-pay

## 2020-05-25 ENCOUNTER — Ambulatory Visit (HOSPITAL_COMMUNITY)
Admission: RE | Admit: 2020-05-25 | Discharge: 2020-05-25 | Disposition: A | Discharge status: Home / Self Care | Payer: 59 | Source: Ambulatory Visit | Attending: Cardiology | Admitting: Cardiology

## 2020-05-25 DIAGNOSIS — R079 Chest pain, unspecified: Secondary | ICD-10-CM | POA: Insufficient documentation

## 2020-05-25 DIAGNOSIS — E782 Mixed hyperlipidemia: Secondary | ICD-10-CM | POA: Diagnosis not present

## 2020-05-25 DIAGNOSIS — I471 Supraventricular tachycardia: Secondary | ICD-10-CM | POA: Diagnosis not present

## 2020-05-25 DIAGNOSIS — K449 Diaphragmatic hernia without obstruction or gangrene: Secondary | ICD-10-CM | POA: Diagnosis not present

## 2020-05-25 DIAGNOSIS — I2510 Atherosclerotic heart disease of native coronary artery without angina pectoris: Secondary | ICD-10-CM | POA: Insufficient documentation

## 2020-05-25 DIAGNOSIS — E669 Obesity, unspecified: Secondary | ICD-10-CM | POA: Insufficient documentation

## 2020-05-25 DIAGNOSIS — I10 Essential (primary) hypertension: Secondary | ICD-10-CM | POA: Diagnosis not present

## 2020-05-25 MED ORDER — NITROGLYCERIN 0.4 MG SL SUBL
0.8000 mg | SUBLINGUAL_TABLET | Freq: Once | SUBLINGUAL | Status: AC
  Administered 2020-05-25: 0.8 mg via SUBLINGUAL

## 2020-05-25 MED ORDER — NITROGLYCERIN 0.4 MG SL SUBL
SUBLINGUAL_TABLET | SUBLINGUAL | Status: AC
  Filled 2020-05-25: qty 2

## 2020-05-25 MED ORDER — IOHEXOL 350 MG/ML SOLN
80.0000 mL | Freq: Once | INTRAVENOUS | Status: AC | PRN
  Administered 2020-05-25: 80 mL via INTRAVENOUS

## 2020-05-28 ENCOUNTER — Telehealth: Payer: Self-pay

## 2020-05-28 NOTE — Telephone Encounter (Signed)
-----   Message from Alver Sorrow, NP sent at 05/28/2020  2:57 PM EDT ----- Cardiac CT shows mild nonobstructive CAD. Recommend secondary prevention of coronary artery disease - continue Crestor and Coreg. Recommend she start Aspirin 81mg  daily.   Non-cardiac read by radiology shows moderate to large hiatal hernia. Recommend she follow up with her PCP, have fowarded results to them.

## 2020-05-28 NOTE — Telephone Encounter (Signed)
Spoke with patient regarding results and recommendation.  Patient verbalizes understanding and is agreeable to plan of care. Advised patient to call back with any issues or concerns.  

## 2020-06-06 MED ORDER — HYDROCHLOROTHIAZIDE 12.5 MG PO CAPS: 12.5000 mg | ORAL_CAPSULE | Freq: Every day | ORAL | 3 refills | Status: DC

## 2020-06-06 NOTE — Addendum Note (Signed)
Addended by: Eleonore Chiquito on: 06/06/2020 05:26 PM   Modules accepted: Orders

## 2020-06-29 DIAGNOSIS — K449 Diaphragmatic hernia without obstruction or gangrene: Secondary | ICD-10-CM | POA: Insufficient documentation

## 2020-06-29 DIAGNOSIS — K219 Gastro-esophageal reflux disease without esophagitis: Secondary | ICD-10-CM | POA: Insufficient documentation

## 2020-07-18 ENCOUNTER — Ambulatory Visit: Payer: 59 | Admitting: Cardiology

## 2020-09-03 ENCOUNTER — Ambulatory Visit (INDEPENDENT_AMBULATORY_CARE_PROVIDER_SITE_OTHER): Payer: 59 | Admitting: Cardiology

## 2020-09-03 ENCOUNTER — Other Ambulatory Visit: Payer: Self-pay

## 2020-09-03 ENCOUNTER — Encounter: Payer: Self-pay | Admitting: Cardiology

## 2020-09-03 VITALS — BP 122/80 | HR 74 | Ht 65.0 in | Wt 217.0 lb

## 2020-09-03 DIAGNOSIS — I10 Essential (primary) hypertension: Secondary | ICD-10-CM

## 2020-09-03 DIAGNOSIS — I2510 Atherosclerotic heart disease of native coronary artery without angina pectoris: Secondary | ICD-10-CM | POA: Diagnosis not present

## 2020-09-03 DIAGNOSIS — E669 Obesity, unspecified: Secondary | ICD-10-CM

## 2020-09-03 DIAGNOSIS — I471 Supraventricular tachycardia: Secondary | ICD-10-CM | POA: Diagnosis not present

## 2020-09-03 DIAGNOSIS — E782 Mixed hyperlipidemia: Secondary | ICD-10-CM

## 2020-09-03 NOTE — Patient Instructions (Signed)
Medication Instructions:  No medication changes. *If you need a refill on your cardiac medications before your next appointment, please call your pharmacy*   Lab Work: None ordered If you have labs (blood work) drawn today and your tests are completely normal, you will receive your results only by: . MyChart Message (if you have MyChart) OR . A paper copy in the mail If you have any lab test that is abnormal or we need to change your treatment, we will call you to review the results.   Testing/Procedures: None ordered   Follow-Up: At [facility], you and your health needs are our priority.  As part of our continuing mission to provide you with exceptional heart care, we have created designated Provider Care Teams.  These Care Teams include your primary Cardiologist (physician) and Advanced Practice Providers (APPs -  Physician Assistants and Nurse Practitioners) who all work together to provide you with the care you need, when you need it.  We recommend signing up for the patient portal called "MyChart".  Sign up information is provided on this After Visit Summary.  MyChart is used to connect with patients for Virtual Visits (Telemedicine).  Patients are able to view lab/test results, encounter notes, upcoming appointments, etc.  Non-urgent messages can be sent to your provider as well.   To learn more about what you can do with MyChart, go to [email or web address].    Your next appointment:   3 month(s)  The format for your next appointment:   In Person  Provider:   Kardie Tobb, DO   Other Instructions NA 

## 2020-09-03 NOTE — Progress Notes (Signed)
Cardiology Office Note:    Date:  09/03/2020   ID:  Julia Mckinney Goldberg, DOB 09/12/1961, MRN [id number]  PCP:  Loura PardonGarcia-Grider, Angela M, PA  Cardiologist:  Thomasene RippleKardie Massai Hankerson, DO  Electrophysiologist:  None   Referring MD: Loura PardonGarcia-Grider, Angela M*   Chief Complaint  Patient presents with  . Follow-up    History of Present Illness:    Julia Mckinney is a 59 y.o. female with a hx of paroxysmal SVT, hypertension, hyperlipidemia, CAD seen on coronary CTA presents today for follow-up visit.  Did see the patient  Past Medical History:  Diagnosis Date  . Astigmatism of left eye 01/09/2012  . Bilateral dry eyes 01/09/2012  . Collapsed lung   . Endometriosis   . Family history of breast cancer 02/03/2019  . Generalized anxiety disorder 09/30/2019  . History of hysterectomy 02/22/2020  . Keratitis 08/29/2011   Formatting of this note might be different from the original. HSV  . Keratitis, herpetic   . 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 Oral aphthae 05/08/2013  . PVD (posterior vitreous detachment), both eyes 05/11/2015  . 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 SVT (supraventricular tachycardia) (HCC)   . Vitamin D deficiency 10/10/2019    Past Surgical History:  Procedure Laterality Date  . ABDOMINAL HYSTERECTOMY    . PARTIAL HYSTERECTOMY      Current Medications: Current Meds  Medication Sig  . ASPIRIN 81 PO Take 81 mg by mouth daily.  . carvedilol (COREG) 6.25 MG tablet Take 1 tablet (6.25 mg total) by mouth 2 (two) times daily.  . clindamycin (CLEOCIN) 150 MG capsule Take 150 mg by mouth 4 (four) times daily.  . Ergocalciferol (VITAMIN D2) 10 MCG (400 UNIT) TABS SMARTSIG:1 Capsule(s) By Mouth Once a Week  . hydrochlorothiazide (MICROZIDE) 12.5 MG capsule Take 1 capsule (12.5 mg total) by mouth daily.  Marland Kitchen. moxifloxacin (VIGAMOX) 0.5 % ophthalmic solution Place 1 drop into the right eye 4 (four) times daily.  . Multiple Vitamins-Minerals  (MULTIVITAMIN WITH MINERALS) tablet Take 1 tablet by mouth daily.  . nitroGLYCERIN (NITROSTAT) 0.4 MG SL tablet Place 1 tablet (0.4 mg total) under the tongue every 5 (five) minutes as needed.  . Omega-3 Fatty Acids (FISH OIL) 1000 MG CAPS Take 1,000 mg by mouth daily.  Marland Kitchen. omeprazole (PRILOSEC) 40 MG capsule Take 40 mg by mouth 2 (two) times daily.  . rosuvastatin (CRESTOR) 10 MG tablet Take 1 tablet (10 mg total) by mouth daily.  . valACYclovir (VALTREX) 1000 MG tablet Take 1,000 mg by mouth daily.      Allergies:   Amoxicillin, Azithromycin, and Other   Social History   Socioeconomic History  . Marital status: Divorced    Spouse name: Not on file  . Number of children: 2  . Years of education: Not on file  . Highest education level: Not on file  Occupational History  . Occupation: Scientist, physiologicalaralegal    Employer: [employer]  Tobacco Use  . Smoking status: Never Smoker  . Smokeless tobacco: Never Used  Substance and Sexual Activity  . Alcohol use: No  . Drug use: No  . Sexual activity: Not on file  Other Topics Concern  . Not on file  Social History Narrative  . Not on file   Social Determinants of Health   Financial Resource Strain:   . Difficulty of Paying Living Expenses: Not on file  Food Insecurity:   . Worried About Radiation protection practitionerunning Out  of Food in the Last Year: Not on file  . Ran Out of Food in the Last Year: Not on file  Transportation Needs:   . Lack of Transportation (Medical): Not on file  . Lack of Transportation (Non-Medical): Not on file  Physical Activity:   . Days of Exercise per Week: Not on file  . Minutes of Exercise per Session: Not on file  Stress:   . Feeling of Stress : Not on file  Social Connections:   . Frequency of Communication with Friends and Family: Not on file  . Frequency of Social Gatherings with Friends and Family: Not on file  . Attends Religious Services: Not on file  . Active Member of Clubs or Organizations: Not on file  . Attends  Banker Meetings: Not on file  . Marital Status: Not on file     Family History: The patient's family history includes Asthma in her mother; Atrial fibrillation in her mother; Breast cancer in her sister; Diabetes in her mother, sister, and sister; Heart attack in her maternal grandfather and maternal grandmother; Heart attack (age of onset: 85) in her father; Hypertension in her mother.  ROS:   Review of Systems  Constitution: Negative for decreased appetite, fever and weight gain.  HENT: Negative for congestion, ear discharge, hoarse voice and sore throat.   Eyes: Negative for discharge, redness, vision loss in right eye and visual halos.  Cardiovascular: Negative for chest pain, dyspnea on exertion, leg swelling, orthopnea and palpitations.  Respiratory: Negative for cough, hemoptysis, shortness of breath and snoring.   Endocrine: Negative for heat intolerance and polyphagia.  Hematologic/Lymphatic: Negative for bleeding problem. Does not bruise/bleed easily.  Skin: Negative for flushing, nail changes, rash and suspicious lesions.  Musculoskeletal: Negative for arthritis, joint pain, muscle cramps, myalgias, neck pain and stiffness.  Gastrointestinal: Negative for abdominal pain, bowel incontinence, diarrhea and excessive appetite.  Genitourinary: Negative for decreased libido, genital sores and incomplete emptying.  Neurological: Negative for brief paralysis, focal weakness, headaches and loss of balance.  Psychiatric/Behavioral: Negative for altered mental status, depression and suicidal ideas.  Allergic/Immunologic: Negative for HIV exposure and persistent infections.    EKGs/Labs/Other Studies Reviewed:    The following studies were reviewed today:   EKG:  None today  Echo TTE impression 02/28/2020 1. Left ventricular ejection fraction, by estimation, is 60 to 65%. The left ventricle has normal function. The left ventricle has no regional wall motion  abnormalities. Left ventricular diastolic parameters are consistent with Grade I diastolic  dysfunction (impaired relaxation).  2. Right ventricular systolic function is normal. The right ventricular size is normal. There is normal pulmonary artery systolic pressure.  3. The mitral valve is normal in structure. Trivial mitral valve regurgitation. No evidence of mitral stenosis.  4. The aortic valve is tricuspid. Aortic valve regurgitation is trivial. No aortic stenosis is present.  5. The inferior vena cava is normal in size with greater than 50% respiratory variability, suggesting right atrial pressure of 3 mmHg.   CCTA IMPRESSION: 1. Coronary calcium score of 29. This was 9 percentile for age and sex matched control.  2. Normal coronary origin with right dominance.  3. CAD-RADS 2. Mild non-obstructive CAD (25-49%) in the proximal LAD and ostial portion of OM1. Consider non-atherosclerotic causes of chest pain. Consider preventive therapy and risk factor modification.   Recent Labs: 03/06/2020: B Natriuretic Peptide 52.6; Hemoglobin 14.3; Platelets 231 05/23/2020: BUN 19; Creatinine, Ser 0.86; Potassium 4.8; Sodium 142  Recent Lipid Panel  Component Value Date/Time   CHOL 145 04/06/2020 0824   TRIG 84 04/06/2020 0824   HDL 58 04/06/2020 0824   CHOLHDL 2.5 04/06/2020 0824   LDLCALC 71 04/06/2020 0824    Physical Exam:    VS:  BP 122/80 (BP Location: Left Arm, Patient Position: Sitting, Cuff Size: Large)   Pulse 74   Ht 5\' 5"  (1.651 m)   Wt 217 lb (98.4 kg)   SpO2 99%   BMI 36.11 kg/m     Wt Readings from Last 3 Encounters:  09/03/20 217 lb (98.4 kg)  04/25/20 206 lb 6.4 oz (93.6 kg)  03/06/20 203 lb 14.8 oz (92.5 kg)     GEN: Well nourished, well developed in no acute distress HEENT: Normal NECK: No JVD; No carotid bruits LYMPHATICS: No lymphadenopathy CARDIAC: S1S2 noted,RRR, no murmurs, rubs, gallops RESPIRATORY:  Clear to auscultation without rales,  wheezing or rhonchi  ABDOMEN: Soft, non-tender, non-distended, +bowel sounds, no guarding. EXTREMITIES: No edema, No cyanosis, no clubbing MUSCULOSKELETAL:  No deformity  SKIN: Warm and dry NEUROLOGIC:  Alert and oriented x 3, non-focal PSYCHIATRIC:  Normal affect, good insight  ASSESSMENT:    1. Coronary artery disease involving native heart, unspecified vessel or lesion type, unspecified whether angina present   2. Morbid obesity (HCC)   3. SVT (supraventricular tachycardia) (HCC)   4. Essential hypertension   5. Mixed hyperlipidemia   6. Obesity (BMI 30-39.9)    PLAN:    She has no anginal symptoms.  She tells me that she is planning surgery for her hiatal hernia.,  Cardiovascular standpoint the patient does not have any unstable cardiac conditions.  Upon evaluation today, she can achieve 4 METs or greater without anginal symptoms.  According to [facility] and AHA guidelines, she requires no further cardiac workup prior to her noncardiac surgery and should be at acceptable risk.    Her blood pressure is acceptable at this time no changes in antihypertensive medicine.  Continue her Crestor 10 mg daily.  Her most recent lipid profile showed LDL 71.  The patient understands the need to lose weight with diet and exercise. We have discussed specific strategies for this.  I also referred the patient to our healthy weight and wellness program.  The patient is in agreement with the above plan. The patient left the office in stable condition.  The patient will follow up in 3 months post operatively.   Medication Adjustments/Labs and Tests Ordered: Current medicines are reviewed at length with the patient today.  Concerns regarding medicines are outlined above.  Orders Placed This Encounter  Procedures  . AMB Referral to [facility]   No orders of the defined types were placed in this encounter.   Patient Instructions  Medication Instructions:  No medication changes. *If you need a  refill on your cardiac medications before your next appointment, please call your pharmacy*   Lab Work: None ordered If you have labs (blood work) drawn today and your tests are completely normal, you will receive your results only by: [facility] MyChart Message (if you have MyChart) OR . A paper copy in the mail If you have any lab test that is abnormal or we need to change your treatment, we will call you to review the results.   Testing/Procedures: None ordered   Follow-Up: At [facility], you and your health needs are our priority.  As part of our continuing mission to provide you with exceptional heart care, we have created designated Provider Care Teams.  These Care Teams include your primary Cardiologist (physician) and Advanced Practice Providers (APPs -  Physician Assistants and Nurse Practitioners) who all work together to provide you with the care you need, when you need it.  We recommend signing up for the patient portal called "MyChart".  Sign up information is provided on this After Visit Summary.  MyChart is used to connect with patients for Virtual Visits (Telemedicine).  Patients are able to view lab/test results, encounter notes, upcoming appointments, etc.  Non-urgent messages can be sent to your provider as well.   To learn more about what you can do with MyChart, go to [email or web address].    Your next appointment:   3 month(s)  The format for your next appointment:   In Person  Provider:   Thomasene Ripple, DO   Other Instructions NA     Adopting a Healthy Lifestyle.  Know what a healthy weight is for you (roughly BMI <25) and aim to maintain this   Aim for 7+ servings of fruits and vegetables daily   65-80+ fluid ounces of water or unsweet tea for healthy kidneys   Limit to max 1 drink of alcohol per day; avoid smoking/tobacco   Limit animal fats in diet for cholesterol and heart health - choose grass fed whenever available   Avoid highly processed  foods, and foods high in saturated/trans fats   Aim for low stress - take time to unwind and care for your mental health   Aim for 150 min of moderate intensity exercise weekly for heart health, and weights twice weekly for bone health   Aim for 7-9 hours of sleep daily   When it comes to diets, agreement about the perfect plan isnt easy to find, even among the experts. Experts at the [facility] [employer] developed an idea known as the Healthy Eating Plate. Just imagine a plate divided into logical, healthy portions.   The emphasis is on diet quality:   Load up on vegetables and fruits - one-half of your plate: Aim for color and variety, and remember that potatoes dont count.   Go for whole grains - one-quarter of your plate: Whole wheat, barley, wheat berries, quinoa, oats, brown rice, and foods made with them. If you want pasta, go with whole wheat pasta.   Protein power - one-quarter of your plate: Fish, chicken, beans, and nuts are all healthy, versatile protein sources. Limit red meat.   The diet, however, does go beyond the plate, offering a few other suggestions.   Use healthy plant oils, such as olive, canola, soy, corn, sunflower and peanut. Check the labels, and avoid partially hydrogenated oil, which have unhealthy trans fats.   If youre thirsty, drink water. Coffee and tea are good in moderation, but skip sugary drinks and limit milk and dairy products to one or two daily servings.   The type of carbohydrate in the diet is more important than the amount. Some sources of carbohydrates, such as vegetables, fruits, whole grains, and beans-are healthier than others.   Finally, stay active  Signed, Thomasene Ripple, DO  09/03/2020 5:05 PM    [facility]

## 2020-09-14 DIAGNOSIS — R7303 Prediabetes: Secondary | ICD-10-CM | POA: Insufficient documentation

## 2020-09-29 ENCOUNTER — Other Ambulatory Visit: Payer: Self-pay | Admitting: Cardiology

## 2020-11-25 ENCOUNTER — Other Ambulatory Visit: Payer: Self-pay | Admitting: Cardiology

## 2020-12-17 ENCOUNTER — Ambulatory Visit: Payer: 59 | Admitting: Cardiology

## 2021-01-07 ENCOUNTER — Other Ambulatory Visit: Payer: Self-pay

## 2021-01-07 DIAGNOSIS — J9819 Other pulmonary collapse: Secondary | ICD-10-CM | POA: Insufficient documentation

## 2021-01-07 DIAGNOSIS — N809 Endometriosis, unspecified: Secondary | ICD-10-CM | POA: Insufficient documentation

## 2021-01-08 ENCOUNTER — Other Ambulatory Visit: Payer: Self-pay

## 2021-01-08 ENCOUNTER — Ambulatory Visit: Payer: 59 | Admitting: Cardiology

## 2021-01-08 ENCOUNTER — Encounter: Payer: Self-pay | Admitting: Cardiology

## 2021-01-08 VITALS — BP 142/86 | HR 80 | Ht 65.0 in | Wt 204.1 lb

## 2021-01-08 DIAGNOSIS — I471 Supraventricular tachycardia: Secondary | ICD-10-CM | POA: Diagnosis not present

## 2021-01-08 DIAGNOSIS — I10 Essential (primary) hypertension: Secondary | ICD-10-CM | POA: Diagnosis not present

## 2021-01-08 DIAGNOSIS — E782 Mixed hyperlipidemia: Secondary | ICD-10-CM | POA: Diagnosis not present

## 2021-01-08 DIAGNOSIS — R7303 Prediabetes: Secondary | ICD-10-CM

## 2021-01-08 DIAGNOSIS — I2510 Atherosclerotic heart disease of native coronary artery without angina pectoris: Secondary | ICD-10-CM

## 2021-01-08 NOTE — Patient Instructions (Signed)
Medication Instructions:  Your physician has recommended you make the following change in your medication:  INCREASE: Hydrochlorothiazide 25 mg daily  *If you need a refill on your cardiac medications before your next appointment, please call your pharmacy*   Lab Work: Your physician recommends that you return for lab work: TODAY BMET, Mag, TSH  If you have labs (blood work) drawn today and your tests are completely normal, you will receive your results only by: [facility] MyChart Message (if you have MyChart) OR . A paper copy in the mail If you have any lab test that is abnormal or we need to change your treatment, we will call you to review the results.   Testing/Procedures: None   Follow-Up: At [facility], you and your health needs are our priority.  As part of our continuing mission to provide you with exceptional heart care, we have created designated Provider Care Teams.  These Care Teams include your primary Cardiologist (physician) and Advanced Practice Providers (APPs -  Physician Assistants and Nurse Practitioners) who all work together to provide you with the care you need, when you need it.  We recommend signing up for the patient portal called "MyChart".  Sign up information is provided on this After Visit Summary.  MyChart is used to connect with patients for Virtual Visits (Telemedicine).  Patients are able to view lab/test results, encounter notes, upcoming appointments, etc.  Non-urgent messages can be sent to your provider as well.   To learn more about what you can do with MyChart, go to [email or web address].    Your next appointment:   8 week(s)  The format for your next appointment:   In Person  Provider:   Thomasene Ripple, DO   Other Instructions

## 2021-01-08 NOTE — Progress Notes (Signed)
Cardiology Office Note:    Date:  01/08/2021   ID:  Julia Mckinney, DOB 02/02/1961, MRN [id number]  PCP:  Julia Pardon, PA  Cardiologist:  Julia Ripple, DO  Electrophysiologist:  None   Referring MD: Julia Mckinney*   I am doing fine  History of Present Illness:    Julia Mckinney is a 60 y.o. female with a hx of paroxysmal SVT, hypertension, hyperlipidemia, CAD seen on coronary CTA is here today for follow-up visit.  Did see the patient on September 03, 2020 at that time she appeared to be doing well from a cardiovascular standpoint. She is here today for follow-up visit.    She started her weight loss program and is happy with her progression.  She tells me she has been feeling some swelling around her neck area.  Past Medical History:  Diagnosis Date  . Astigmatism of left eye 01/09/2012  . Bilateral dry eyes 01/09/2012  . Collapsed lung   . Endometriosis   . Family history of breast cancer 02/03/2019  . Generalized anxiety disorder 09/30/2019  . History of hysterectomy 02/22/2020  . Keratitis 08/29/2011   Formatting of this note might be different from the original. HSV  . Keratitis, herpetic   . 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 Oral aphthae 05/08/2013  . PVD (posterior vitreous detachment), both eyes 05/11/2015  . 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 SVT (supraventricular tachycardia) (HCC)   . Vitamin D deficiency 10/10/2019    Past Surgical History:  Procedure Laterality Date  . ABDOMINAL HYSTERECTOMY    . PARTIAL HYSTERECTOMY      Current Medications: Current Meds  Medication Sig  . ASPIRIN 81 PO Take 81 mg by mouth daily.  . carvedilol (COREG) 6.25 MG tablet Take 1 tablet (6.25 mg total) by mouth 2 (two) times daily.  . Ergocalciferol (VITAMIN D2) 10 MCG (400 UNIT) TABS SMARTSIG:1 Capsule(s) By Mouth Once a Week  . hydrochlorothiazide (MICROZIDE) 12.5 MG capsule Take 1 capsule (12.5 mg total) by  mouth daily.  . Multiple Vitamins-Minerals (MULTIVITAMIN WITH MINERALS) tablet Take 1 tablet by mouth daily.  . Omega-3 Fatty Acids (FISH OIL) 1000 MG CAPS Take 1,000 mg by mouth daily.  Marland Kitchen omeprazole (PRILOSEC) 40 MG capsule Take 40 mg by mouth 2 (two) times daily.  . phentermine 15 MG capsule Take 15 mg by mouth daily.  . valACYclovir (VALTREX) 1000 MG tablet Take 1,000 mg by mouth daily.      Allergies:   Amoxicillin, Azithromycin, and Other   Social History   Socioeconomic History  . Marital status: Divorced    Spouse name: Not on file  . Number of children: 2  . Years of education: Not on file  . Highest education level: Not on file  Occupational History  . Occupation: Scientist, physiological: Air traffic controller  Tobacco Use  . Smoking status: Never Smoker  . Smokeless tobacco: Never Used  Substance and Sexual Activity  . Alcohol use: No  . Drug use: No  . Sexual activity: Not on file  Other Topics Concern  . Not on file  Social History Narrative  . Not on file   Social Determinants of Health   Financial Resource Strain: Not on file  Food Insecurity: Not on file  Transportation Needs: Not on file  Physical Activity: Not on file  Stress: Not on file  Social Connections: Not on file  Family History: The patient's family history includes Asthma in her mother; Atrial fibrillation in her mother; Breast cancer in her sister; Diabetes in her mother, sister, and sister; Heart attack in her maternal grandfather and maternal grandmother; Heart attack (age of onset: 64) in her father; Hypertension in her mother.  ROS:   Review of Systems  Constitution: Negative for decreased appetite, fever and weight gain.  HENT: Negative for congestion, ear discharge, hoarse voice and sore throat.   Eyes: Negative for discharge, redness, vision loss in right eye and visual halos.  Cardiovascular: Negative for chest pain, dyspnea on exertion, leg swelling, orthopnea and  palpitations.  Respiratory: Negative for cough, hemoptysis, shortness of breath and snoring.   Endocrine: Negative for heat intolerance and polyphagia.  Hematologic/Lymphatic: Negative for bleeding problem. Does not bruise/bleed easily.  Skin: Negative for flushing, nail changes, rash and suspicious lesions.  Musculoskeletal: Negative for arthritis, joint pain, muscle cramps, myalgias, neck pain and stiffness.  Gastrointestinal: Negative for abdominal pain, bowel incontinence, diarrhea and excessive appetite.  Genitourinary: Negative for decreased libido, genital sores and incomplete emptying.  Neurological: Negative for brief paralysis, focal weakness, headaches and loss of balance.  Psychiatric/Behavioral: Negative for altered mental status, depression and suicidal ideas.  Allergic/Immunologic: Negative for HIV exposure and persistent infections.    EKGs/Labs/Other Studies Reviewed:    The following studies were reviewed today:   EKG: None today  Echo TTE impression03/30/2021 1. Left ventricular ejection fraction, by estimation, is 60 to 65%. The left ventricle has normal function. The left ventricle has no regional wall motion abnormalities. Left ventricular diastolic parameters are consistent with Grade I diastolic  dysfunction (impaired relaxation).  2. Right ventricular systolic function is normal. The right ventricular size is normal. There is normal pulmonary artery systolic pressure.  3. The mitral valve is normal in structure. Trivial mitral valve regurgitation. No evidence of mitral stenosis.  4. The aortic valve is tricuspid. Aortic valve regurgitation is trivial. No aortic stenosis is present.  5. The inferior vena cava is normal in size with greater than 50% respiratory variability, suggesting right atrial pressure of 3 mmHg.   CCTA IMPRESSION: 1. Coronary calcium score of 29. This was 77 percentile for age and sex matched control.  2. Normal coronary origin  with right dominance.  3. CAD-RADS 2. Mild non-obstructive CAD (25-49%) in the proximal LAD and ostial portion of OM1. Consider non-atherosclerotic causes of chest pain. Consider preventive therapy and risk factor modification.   Recent Labs: 03/06/2020: B Natriuretic Peptide 52.6; Hemoglobin 14.3; Platelets 231 05/23/2020: BUN 19; Creatinine, Ser 0.86; Potassium 4.8; Sodium 142  Recent Lipid Panel    Component Value Date/Time   CHOL 145 04/06/2020 0824   TRIG 84 04/06/2020 0824   HDL 58 04/06/2020 0824   CHOLHDL 2.5 04/06/2020 0824   LDLCALC 71 04/06/2020 0824    Physical Exam:    VS:  BP (!) 142/86   Pulse 80   Ht 5\' 5"  (1.651 m)   Wt 204 lb 1.6 oz (92.6 kg)   SpO2 98%   BMI 33.96 kg/m     Wt Readings from Last 3 Encounters:  01/08/21 204 lb 1.6 oz (92.6 kg)  09/03/20 217 lb (98.4 kg)  04/25/20 206 lb 6.4 oz (93.6 kg)     GEN: Well nourished, well developed in no acute distress HEENT: Normal NECK: Neck swelling/lipoma, No JVD; No carotid bruits LYMPHATICS: No lymphadenopathy CARDIAC: S1S2 noted,RRR, no murmurs, rubs, gallops RESPIRATORY:  Clear to auscultation without rales, wheezing  or rhonchi  ABDOMEN: Soft, non-tender, non-distended, +bowel sounds, no guarding. EXTREMITIES: No edema, No cyanosis, no clubbing MUSCULOSKELETAL:  No deformity  SKIN: Warm and dry NEUROLOGIC:  Alert and oriented x 3, non-focal PSYCHIATRIC:  Normal affect, good insight  ASSESSMENT:    1. SVT (supraventricular tachycardia) (HCC)   2. Essential hypertension   3. Mixed hyperlipidemia   4. Prediabetes   5. Mild CAD    PLAN:     1.  She is hypertensive today in the office I like to increase her hydrochlorothiazide to 25 mg daily.  She is can take her blood pressure daily and bring that information has next visit.  I congratulated the patient as she has lost some weight since I last saw her.  2.  No angina symptoms continue patient on her aspirin.  Crestor per  3.  She has been  on a weight loss program and is actively working on this.  The patient is in agreement with the above plan. The patient left the office in stable condition.  The patient will follow up in   Medication Adjustments/Labs and Tests Ordered: Current medicines are reviewed at length with the patient today.  Concerns regarding medicines are outlined above.  No orders of the defined types were placed in this encounter.  No orders of the defined types were placed in this encounter.   Patient Instructions  Medication Instructions:  Your physician has recommended you make the following change in your medication:  INCREASE: Hydrochlorothiazide 25 mg daily  *If you need a refill on your cardiac medications before your next appointment, please call your pharmacy*   Lab Work: Your physician recommends that you return for lab work: TODAY BMET, Mag, TSH  If you have labs (blood work) drawn today and your tests are completely normal, you will receive your results only by: Marland Kitchen MyChart Message (if you have MyChart) OR . A paper copy in the mail If you have any lab test that is abnormal or we need to change your treatment, we will call you to review the results.   Testing/Procedures: None   Follow-Up: At [facility], you and your health needs are our priority.  As part of our continuing mission to provide you with exceptional heart care, we have created designated Provider Care Teams.  These Care Teams include your primary Cardiologist (physician) and Advanced Practice Providers (APPs -  Physician Assistants and Nurse Practitioners) who all work together to provide you with the care you need, when you need it.  We recommend signing up for the patient portal called "MyChart".  Sign up information is provided on this After Visit Summary.  MyChart is used to connect with patients for Virtual Visits (Telemedicine).  Patients are able to view lab/test results, encounter notes, upcoming appointments, etc.   Non-urgent messages can be sent to your provider as well.   To learn more about what you can do with MyChart, go to [email or web address].    Your next appointment:   8 week(s)  The format for your next appointment:   In Person  Provider:   Thomasene Ripple, DO   Other Instructions       Adopting a Healthy Lifestyle.  Know what a healthy weight is for you (roughly BMI <25) and aim to maintain this   Aim for 7+ servings of fruits and vegetables daily   65-80+ fluid ounces of water or unsweet tea for healthy kidneys   Limit to max 1 drink of alcohol per day;  avoid smoking/tobacco   Limit animal fats in diet for cholesterol and heart health - choose grass fed whenever available   Avoid highly processed foods, and foods high in saturated/trans fats   Aim for low stress - take time to unwind and care for your mental health   Aim for 150 min of moderate intensity exercise weekly for heart health, and weights twice weekly for bone health   Aim for 7-9 hours of sleep daily   When it comes to diets, agreement about the perfect plan isnt easy to find, even among the experts. Experts at the [facility] of [employer] developed an idea known as the Healthy Eating Plate. Just imagine a plate divided into logical, healthy portions.   The emphasis is on diet quality:   Load up on vegetables and fruits - one-half of your plate: Aim for color and variety, and remember that potatoes dont count.   Go for whole grains - one-quarter of your plate: Whole wheat, barley, wheat berries, quinoa, oats, brown rice, and foods made with them. If you want pasta, go with whole wheat pasta.   Protein power - one-quarter of your plate: Fish, chicken, beans, and nuts are all healthy, versatile protein sources. Limit red meat.   The diet, however, does go beyond the plate, offering a few other suggestions.   Use healthy plant oils, such as olive, canola, soy, corn, sunflower and peanut. Check the  labels, and avoid partially hydrogenated oil, which have unhealthy trans fats.   If youre thirsty, drink water. Coffee and tea are good in moderation, but skip sugary drinks and limit milk and dairy products to one or two daily servings.   The type of carbohydrate in the diet is more important than the amount. Some sources of carbohydrates, such as vegetables, fruits, whole grains, and beans-are healthier than others.   Finally, stay active  Signed, Julia Ripple, DO  01/08/2021 3:51 PM    [facility]

## 2021-01-09 LAB — BASIC METABOLIC PANEL
BUN/Creatinine Ratio: 15 (ref 9–23)
BUN: 15 mg/dL (ref 6–24)
CO2: 23 mmol/L (ref 20–29)
Calcium: 10.1 mg/dL (ref 8.7–10.2)
Chloride: 102 mmol/L (ref 96–106)
Creatinine, Ser: 0.98 mg/dL (ref 0.57–1.00)
GFR calc Af Amer: 74 mL/min/{1.73_m2} (ref >59)
GFR calc non Af Amer: 64 mL/min/{1.73_m2} (ref >59)
Glucose: 97 mg/dL (ref 65–99)
Potassium: 4.1 mmol/L (ref 3.5–5.2)
Sodium: 144 mmol/L (ref 134–144)

## 2021-01-09 LAB — TSH: TSH: 1.23 u[IU]/mL (ref 0.450–4.500)

## 2021-01-09 LAB — MAGNESIUM: Magnesium: 2.2 mg/dL (ref 1.6–2.3)

## 2021-01-23 ENCOUNTER — Other Ambulatory Visit: Payer: Self-pay

## 2021-01-23 ENCOUNTER — Other Ambulatory Visit: Payer: Self-pay | Admitting: Cardiology

## 2021-01-23 MED ORDER — HYDROCHLOROTHIAZIDE 25 MG PO TABS: 25.0000 mg | ORAL_TABLET | Freq: Every day | ORAL | 3 refills | Status: DC

## 2021-01-23 NOTE — Progress Notes (Signed)
Prescription sent in [facility] in [facility]

## 2021-01-23 NOTE — Telephone Encounter (Signed)
Refill sent to pharmacy.   

## 2021-03-13 ENCOUNTER — Ambulatory Visit: Payer: 59 | Admitting: Cardiology

## 2021-06-02 ENCOUNTER — Other Ambulatory Visit: Payer: Self-pay | Admitting: Cardiology

## 2021-06-05 NOTE — Telephone Encounter (Signed)
Carvedilol 6.25 mg # 60 only with message for patient needs appointment for further refills sent to [facility] # [id number] [place]

## 2021-07-08 ENCOUNTER — Other Ambulatory Visit: Payer: Self-pay | Admitting: Cardiology

## 2021-11-28 IMAGING — CR DG CHEST 2V
2 series · 2 of 2 positions shown · non-contrast
Comparison: None

CLINICAL DATA: Chest pressure, shortness of breath

EXAM:
CHEST - 2 VIEW

[w chest pa]
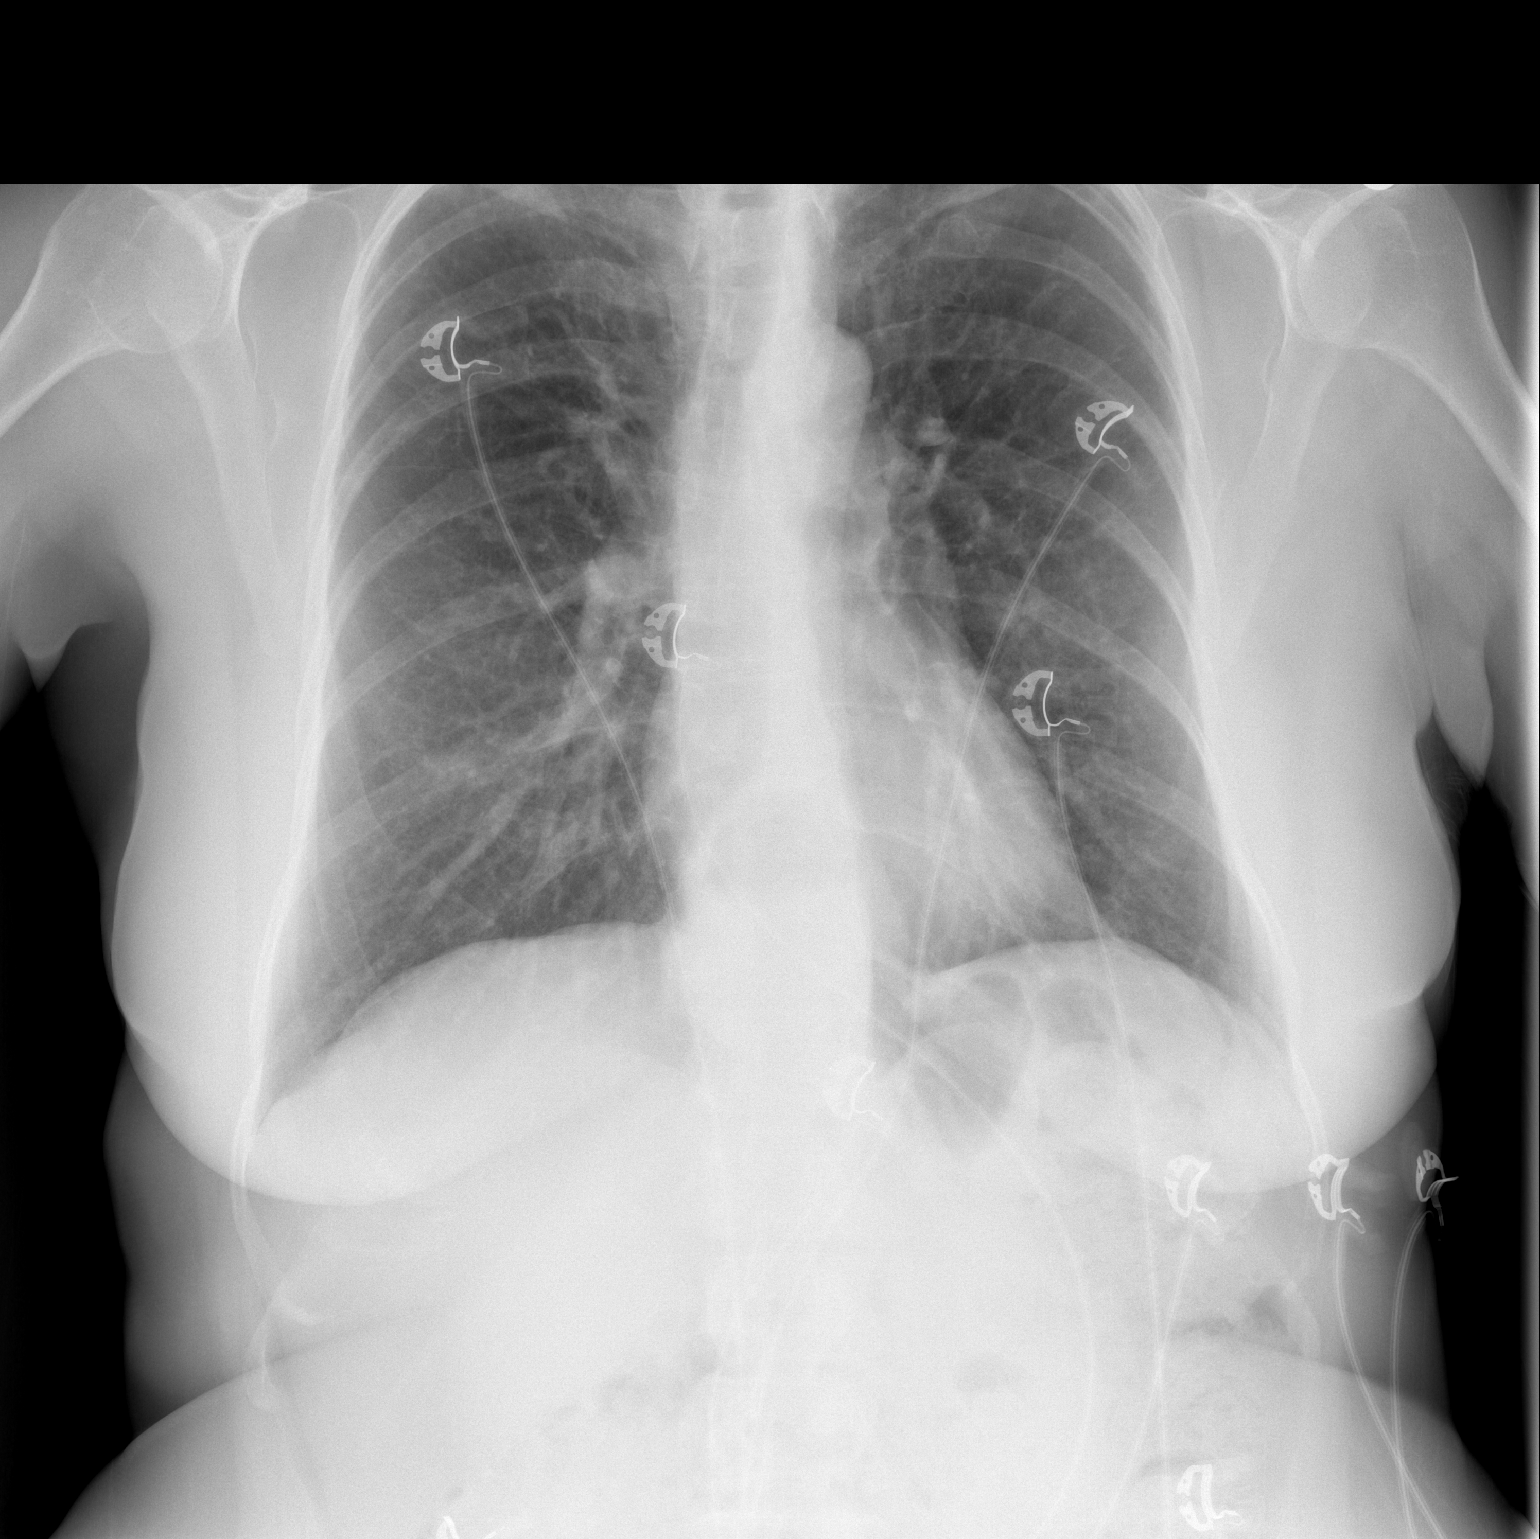

[w chest lat]
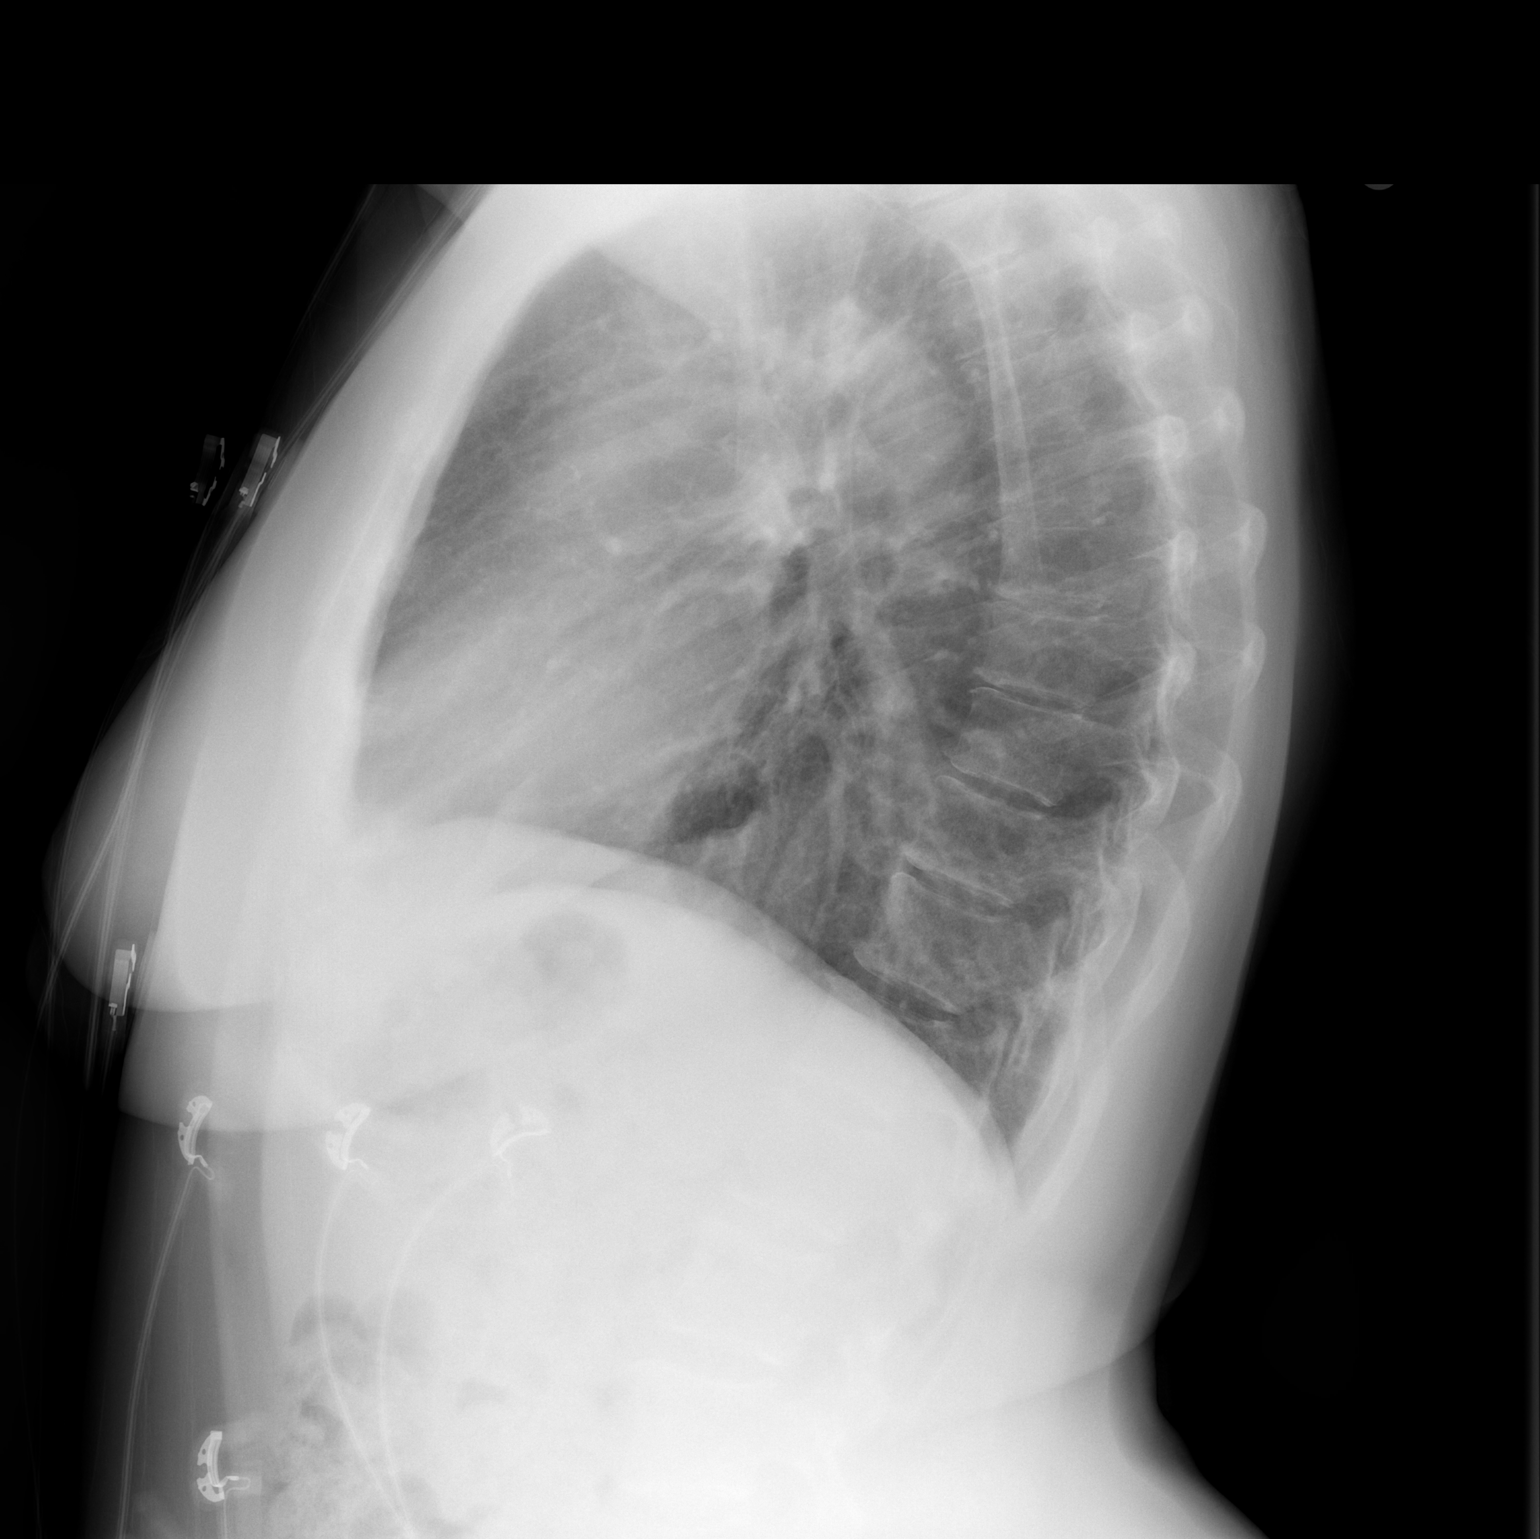

[2 of 2 positions shown; findings below may reference images not displayed]

FINDINGS: Heart and mediastinal contours are within normal limits. No focal
opacities or effusions. No acute bony abnormality.
IMPRESSION: No active cardiopulmonary disease.

## 2021-12-09 ENCOUNTER — Other Ambulatory Visit: Payer: Self-pay | Admitting: Cardiology

## 2021-12-30 ENCOUNTER — Other Ambulatory Visit: Payer: Self-pay | Admitting: Cardiology

## 2021-12-30 ENCOUNTER — Telehealth: Payer: Self-pay

## 2021-12-30 MED ORDER — CARVEDILOL 6.25 MG PO TABS: ORAL_TABLET | ORAL | 0 refills | Status: DC

## 2021-12-30 NOTE — Telephone Encounter (Signed)
Called pt due to fax we received for refill on Carvedilol. 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 My calender is at the office, I will have to call back to get it up." Sent in refill for 30 day supply. Pt does not want to come to [facility] to be seen. Pt given [place] office number to make an appt, pt does not want to transfer to [facility].

## 2022-01-07 ENCOUNTER — Other Ambulatory Visit: Payer: Self-pay | Admitting: Cardiology

## 2022-01-14 ENCOUNTER — Telehealth: Payer: Self-pay | Admitting: Cardiology

## 2022-01-14 MED ORDER — CARVEDILOL 6.25 MG PO TABS: ORAL_TABLET | ORAL | 0 refills | Status: DC

## 2022-01-14 NOTE — Telephone Encounter (Signed)
Medication sent 30 days supply. But must keep her upcoming appt with Dr. Raliegh Ip for further refills/ LM to return my call.

## 2022-01-14 NOTE — Telephone Encounter (Signed)
°*  STAT* If patient is at the pharmacy, call can be transferred to refill team.   1. Which medications need to be refilled? (please list name of each medication and dose if known)  carvedilol (COREG) 6.25 MG tablet  2. Which pharmacy/location (including street and city if local pharmacy) is medication to be sent to? [id number] - [place], [place] - [address]  3. Do they need a 30 day or 90 day supply? 30 with refills  Patient is scheduled for f/u visit 02/20/22

## 2022-02-09 ENCOUNTER — Other Ambulatory Visit: Payer: Self-pay | Admitting: Cardiology

## 2022-02-20 ENCOUNTER — Other Ambulatory Visit: Payer: Self-pay

## 2022-02-20 ENCOUNTER — Encounter: Payer: Self-pay | Admitting: Cardiology

## 2022-02-20 ENCOUNTER — Ambulatory Visit: Payer: 59 | Admitting: Cardiology

## 2022-02-20 VITALS — BP 132/76 | HR 73 | Ht 65.0 in | Wt 199.0 lb

## 2022-02-20 DIAGNOSIS — R7303 Prediabetes: Secondary | ICD-10-CM

## 2022-02-20 DIAGNOSIS — I471 Supraventricular tachycardia: Secondary | ICD-10-CM | POA: Diagnosis not present

## 2022-02-20 DIAGNOSIS — R0609 Other forms of dyspnea: Secondary | ICD-10-CM | POA: Diagnosis not present

## 2022-02-20 DIAGNOSIS — I2510 Atherosclerotic heart disease of native coronary artery without angina pectoris: Secondary | ICD-10-CM | POA: Diagnosis not present

## 2022-02-20 DIAGNOSIS — I10 Essential (primary) hypertension: Secondary | ICD-10-CM

## 2022-02-20 MED ORDER — ROSUVASTATIN CALCIUM 10 MG PO TABS: 10.0000 mg | ORAL_TABLET | Freq: Every day | ORAL | 3 refills | Status: DC

## 2022-02-20 MED ORDER — CARVEDILOL 6.25 MG PO TABS: ORAL_TABLET | ORAL | 3 refills | Status: DC

## 2022-02-20 MED ORDER — HYDROCHLOROTHIAZIDE 25 MG PO TABS: 25.0000 mg | ORAL_TABLET | Freq: Every day | ORAL | 3 refills | Status: DC

## 2022-02-20 NOTE — Patient Instructions (Signed)
Medication Instructions:  ?Your physician recommends that you continue on your current medications as directed. Please refer to the Current Medication list given to you today. ? ?*If you need a refill on your cardiac medications before your next appointment, please call your pharmacy* ? ? ?Lab Work: ?Your physician recommends that you return for lab work in:  ? ?Labs today: Direct LDL ? ?If you have labs (blood work) drawn today and your tests are completely normal, you will receive your results only by: ?MyChart Message (if you have MyChart) OR ?A paper copy in the mail ?If you have any lab test that is abnormal or we need to change your treatment, we will call you to review the results. ? ? ?Testing/Procedures: ?None ? ? ?Follow-Up: ?At [facility], you and your health needs are our priority.  As part of our continuing mission to provide you with exceptional heart care, we have created designated Provider Care Teams.  These Care Teams include your primary Cardiologist (physician) and Advanced Practice Providers (APPs -  Physician Assistants and Nurse Practitioners) who all work together to provide you with the care you need, when you need it. ? ?We recommend signing up for the patient portal called "MyChart".  Sign up information is provided on this After Visit Summary.  MyChart is used to connect with patients for Virtual Visits (Telemedicine).  Patients are able to view lab/test results, encounter notes, upcoming appointments, etc.  Non-urgent messages can be sent to your provider as well.   ?To learn more about what you can do with MyChart, go to [email or web address].   ? ?Your next appointment:   ?1 year(s) ? ?The format for your next appointment:   ?In Person ? ?Provider:   ?Robert Krasowski, MD  ? ? ?Other Instructions ?None ? ?

## 2022-02-20 NOTE — Progress Notes (Signed)
?Cardiology Office Note:   ? ?Date:  02/20/2022  ? ?ID:  Julia BoltCynthia Mckinney, DOB 01/06/1961, MRN [id number] ? ?PCP:  Julia Mckinney, Julia M, PA  ?Cardiologist:  Julia Balsamobert Ashunti Schofield, MD   ? ?Referring MD: Julia Mckinney, Julia Mckinney*  ? ?Chief Complaint  ?Patient presents with  ? Follow-up  ? ? ?History of Present Illness:   ? ?Julia BoltCynthia Mckinney is a 61 y.o. female with past medical history significant for supraventricular tachycardia, essential hypertension, hyperlipidemia, coronary artery disease as proven by coronary CT angio when she was find to have 25 to 49% stenosis of the proximal LAD as well as obtuse marginal branch. ?She comes today to my office discuss her situation.  Overall doing very well.  She is taking care of her ex-husband who did have a stroke she is no anyone who is taking care of him with his daughter and her daughter does have some back problem.  She end up having right shoulder problem herself.  She denies have any cardiac complaints.  There is no palpitations there is no chest pain tightness squeezing pressure burning chest.  She does what she wants to do without difficulties.  She does not exercise on the regular basis but she is busy working at home.  She is not on any special diet ? ?Past Medical History:  ?Diagnosis Date  ? Astigmatism of left eye 01/09/2012  ? Bilateral dry eyes 01/09/2012  ? Collapsed lung   ? Endometriosis   ? Family history of breast cancer 02/03/2019  ? Generalized anxiety disorder 09/30/2019  ? History of hysterectomy 02/22/2020  ? Keratitis 08/29/2011  ? Formatting of this note might be different from the original. HSV  ? Keratitis, herpetic   ? Nuclear sclerosis of both eyes 05/11/2015  ? Oral aphthae 05/08/2013  ? PVD (posterior vitreous detachment), both eyes 05/11/2015  ? Steroid responder, right eye 08/23/2015  ? Stromal keratitis, right 05/11/2015  ? SVT (supraventricular tachycardia) (HCC)   ? Vitamin D deficiency 10/10/2019  ? ? ?Past Surgical History:  ?Procedure Laterality Date  ?  ABDOMINAL HYSTERECTOMY    ? PARTIAL HYSTERECTOMY    ? ? ?Current Medications: ?Current Meds  ?Medication Sig  ? ASPIRIN 81 PO Take 81 mg by mouth daily.  ? carvedilol (COREG) 6.25 MG tablet TAKE 1 TABLET BY MOUTH TWICE DAILY . APPOINTMENT REQUIRED FOR FUTURE REFILLS. Call [phone] or [phone] to make an appt. (Patient taking differently: Take 6.25 mg by mouth 2 (two) times daily with a meal. TAKE 1 TABLET BY MOUTH TWICE DAILY . APPOINTMENT REQUIRED FOR FUTURE REFILLS. Call [phone] or [phone] to make an appt.)  ? hydrochlorothiazide (HYDRODIURIL) 25 MG tablet Take 1 tablet by mouth once daily (Patient taking differently: Take 25 mg by mouth daily.)  ? Multiple Vitamins-Minerals (MULTIVITAMIN WITH MINERALS) tablet Take 1 tablet by mouth daily.  ? rosuvastatin (CRESTOR) 10 MG tablet TAKE 1 TABLET BY MOUTH ONCE DAILY . APPOINTMENT REQUIRED FOR FUTURE REFILLS (Patient taking differently: Take 10 mg by mouth daily.)  ? valACYclovir (VALTREX) 1000 MG tablet Take 1,000 mg by mouth daily.   ? [DISCONTINUED] Omega-3 Fatty Acids (FISH OIL) 1000 MG CAPS Take 1,000 mg by mouth daily.  ?  ? ?Allergies:   Amoxicillin, Azithromycin, and Other  ? ?Social History  ? ?Socioeconomic History  ? Marital status: Divorced  ?  Spouse name: Not on file  ? Number of children: 2  ? Years of education: Not on file  ? Highest education level:  Not on file  ?Occupational History  ? Occupation: IT consultant  ?  Employer: [employer]otherap[employer]  ?Tobacco Use  ? Smoking status: Never  ? Smokeless tobacco: Never  ?Substance and Sexual Activity  ? Alcohol use: No  ? Drug use: No  ? Sexual activity: Not on file  ?Other Topics Concern  ? Not on file  ?Social History Narrative  ? Not on file  ? ?Social Determinants of Health  ? ?Financial Resource Strain: Not on file  ?Food Insecurity: Not on file  ?Transportation Needs: Not on file  ?Physical Activity: Not on file  ?Stress: Not on file  ?Social Connections: Not on file  ?   ? ?Family History: ?The patient's family history includes Asthma in her mother; Atrial fibrillation in her mother; Breast cancer in her sister; Diabetes in her mother, sister, and sister; Heart attack in her maternal grandfather and maternal grandmother; Heart attack (age of onset: 70) in her father; Hypertension in her mother. ?ROS:   ?Please see the history of present illness.    ?All 14 point review of systems negative except as described per history of present illness ? ?EKGs/Labs/Other Studies Reviewed:   ? ?Echo TTE impression 02/28/2020 ? 1. Left ventricular ejection fraction, by estimation, is 60 to 65%. The left ventricle has normal function. The left ventricle has no regional wall motion abnormalities. Left ventricular diastolic parameters are consistent with Grade I diastolic  ?dysfunction (impaired relaxation).  ? 2. Right ventricular systolic function is normal. The right ventricular size is normal. There is normal pulmonary artery systolic pressure.  ? 3. The mitral valve is normal in structure. Trivial mitral valve regurgitation. No evidence of mitral stenosis.  ? 4. The aortic valve is tricuspid. Aortic valve regurgitation is trivial. No aortic stenosis is present.  ? 5. The inferior vena cava is normal in size with greater than 50% respiratory variability, suggesting right atrial pressure of 3 mmHg. ?  ?  ?CCTA IMPRESSION: ?1. Coronary calcium score of 29. This was 77 percentile for age and ?sex matched control. ?  ?2. Normal coronary origin with right dominance. ?  ?3. CAD-RADS 2. Mild non-obstructive CAD (25-49%) in the proximal LAD and ostial portion of OM1. Consider non-atherosclerotic causes of chest pain. Consider preventive therapy and risk factor ?modification. ?  ? ?Recent Labs: ?No results found for requested labs within last 8760 hours.  ?Recent Lipid Panel ?   ?Component Value Date/Time  ? CHOL 145 04/06/2020 0824  ? TRIG 84 04/06/2020 0824  ? HDL 58 04/06/2020 0824  ? CHOLHDL 2.5  04/06/2020 0824  ? LDLCALC 71 04/06/2020 0824  ? ? ?Physical Exam:   ? ?VS:  There were no vitals taken for this visit.   ? ?Wt Readings from Last 3 Encounters:  ?01/08/21 204 lb 1.6 oz (92.6 kg)  ?09/03/20 217 lb (98.4 kg)  ?04/25/20 206 lb 6.4 oz (93.6 kg)  ?  ? ?GEN:  Well nourished, well developed in no acute distress ?HEENT: Normal ?NECK: No JVD; No carotid bruits ?LYMPHATICS: No lymphadenopathy ?CARDIAC: RRR, no murmurs, no rubs, no gallops ?RESPIRATORY:  Clear to auscultation without rales, wheezing or rhonchi  ?ABDOMEN: Soft, non-tender, non-distended ?MUSCULOSKELETAL:  No edema; No deformity  ?SKIN: Warm and dry ?LOWER EXTREMITIES: no swelling ?NEUROLOGIC:  Alert and oriented x 3 ?PSYCHIATRIC:  Normal affect  ? ?ASSESSMENT:   ? ?1. SVT (supraventricular tachycardia) (HCC)   ?2. Essential hypertension   ?3. Coronary artery disease involving native coronary artery  of native heart without angina pectoris   ?4. Dyspnea on exertion   ?5. Prediabetes   ? ?PLAN:   ? ?In order of problems listed above: ? ?Coronary artery disease likely last evaluation showed nonobstructive.  She does have no symptomatology suggesting activation of the problem.  She is on appropriate medications which I will continue.  We will continue antiplatelet therapy as well as statin. ?Dyslipidemia I was looking for last fasting lipid profile which was from 2021 we need to update his data.  I will check her fasting lipid profile today.  Actually she will have direct LDL done. ?Supraventricular tachycardia successfully suppressed with beta-blocker which I will continue. ?Prediabetes followed by internal medicine team. ? ? ?Medication Adjustments/Labs and Tests Ordered: ?Current medicines are reviewed at length with the patient today.  Concerns regarding medicines are outlined above.  ?No orders of the defined types were placed in this encounter. ? ?Medication changes: No orders of the defined types were placed in this  encounter. ? ? ?Signed, ?Georgeanna Lea, MD, [facility] ?02/20/2022 4:26 PM    ?[facility] ?

## 2022-02-24 DIAGNOSIS — M545 Low back pain, unspecified: Secondary | ICD-10-CM | POA: Insufficient documentation

## 2022-02-26 ENCOUNTER — Telehealth: Payer: Self-pay

## 2022-02-26 LAB — LDL CHOLESTEROL, DIRECT: LDL Direct: 55 mg/dL (ref 0–99)

## 2022-02-26 NOTE — Telephone Encounter (Signed)
Patient notified of results.

## 2022-02-26 NOTE — Telephone Encounter (Signed)
-----   Message from Georgeanna Lea, MD sent at 02/26/2022 11:19 AM EDT ----- ?Cholesterol looks perfect continue present management ?

## 2022-03-14 DIAGNOSIS — M7551 Bursitis of right shoulder: Secondary | ICD-10-CM | POA: Insufficient documentation

## 2022-06-05 ENCOUNTER — Other Ambulatory Visit: Payer: Self-pay

## 2022-06-05 MED ORDER — CARVEDILOL 6.25 MG PO TABS: ORAL_TABLET | ORAL | 2 refills | Status: DC

## 2022-06-10 ENCOUNTER — Telehealth: Payer: Self-pay | Admitting: Cardiology

## 2022-06-10 NOTE — Telephone Encounter (Signed)
*  STAT* If patient is at the pharmacy, call can be transferred to refill team.   1. Which medications need to be refilled? (please list name of each medication and dose if known)   rosuvastatin (CRESTOR) 10 MG tablet  2. Which pharmacy/location (including street and city if local pharmacy) is medication to be sent to?  [facility] [id number] - [place], [place] - [address]  3. Do they need a 30 day or 90 day supply?      90 days  Patient called stating she is out of this medication.    She has changed her pharmacy to [facility] in [place].

## 2022-06-11 MED ORDER — ROSUVASTATIN CALCIUM 10 MG PO TABS: 10.0000 mg | ORAL_TABLET | Freq: Every day | ORAL | 10 refills | Status: DC

## 2023-03-12 ENCOUNTER — Other Ambulatory Visit: Payer: Self-pay | Admitting: Cardiology

## 2023-03-12 NOTE — Telephone Encounter (Signed)
Rx to pharmacy, patient needs appointment for future refills / 2nd attempt

## 2023-03-12 NOTE — Telephone Encounter (Signed)
Rx to pharmacy, patient needs appointment for future refills / 2nd attempt 

## 2023-04-13 ENCOUNTER — Other Ambulatory Visit: Payer: Self-pay

## 2023-04-13 ENCOUNTER — Other Ambulatory Visit: Payer: Self-pay | Admitting: Cardiology

## 2023-04-13 MED ORDER — CARVEDILOL 6.25 MG PO TABS: 6.2500 mg | ORAL_TABLET | Freq: Two times a day (BID) | ORAL | 0 refills | Status: DC

## 2023-05-04 ENCOUNTER — Other Ambulatory Visit: Payer: Self-pay | Admitting: Cardiology

## 2023-05-10 ENCOUNTER — Other Ambulatory Visit: Payer: Self-pay | Admitting: Cardiology

## 2023-05-13 ENCOUNTER — Other Ambulatory Visit: Payer: Self-pay | Admitting: Cardiology

## 2023-05-15 ENCOUNTER — Telehealth: Payer: Self-pay | Admitting: Cardiology

## 2023-05-15 MED ORDER — CARVEDILOL 6.25 MG PO TABS: 6.2500 mg | ORAL_TABLET | Freq: Two times a day (BID) | ORAL | 0 refills | Status: DC

## 2023-05-15 MED ORDER — HYDROCHLOROTHIAZIDE 25 MG PO TABS: 25.0000 mg | ORAL_TABLET | Freq: Every day | ORAL | 0 refills | Status: DC

## 2023-05-15 NOTE — Telephone Encounter (Signed)
Spoke with patient and got her scheduled to see Dr. Janyce Llanos on 08/14/23 as she is overdue for follow up. Informed her we would send in prescription refills to get her through to her appointment.  Informed her if she had any problems to call us and we would get her in sooner. Refill of Carvedilol 6.25 mg and Hydrochlorothiazide 25 mg sent to [facility] on [address], [place].

## 2023-05-15 NOTE — Telephone Encounter (Signed)
*  STAT* If patient is at the pharmacy, call can be transferred to refill team.   1. Which medications need to be refilled? (please list name of each medication and dose if known) Carvedilol and Hydrochlorothiazide  2. Which pharmacy/location (including street and city if local pharmacy) is medication to be sent to?[facility], [place], [place]  3. Do they need a 30 day or 90 day supply? 90 days and refills

## 2023-05-24 ENCOUNTER — Other Ambulatory Visit: Payer: Self-pay | Admitting: Cardiology

## 2023-06-12 ENCOUNTER — Other Ambulatory Visit: Payer: Self-pay | Admitting: *Deleted

## 2023-06-12 ENCOUNTER — Other Ambulatory Visit: Payer: Self-pay | Admitting: Cardiology

## 2023-06-12 MED ORDER — ROSUVASTATIN CALCIUM 10 MG PO TABS: 10.0000 mg | ORAL_TABLET | Freq: Every day | ORAL | 0 refills | Status: DC

## 2023-08-10 ENCOUNTER — Other Ambulatory Visit: Payer: Self-pay | Admitting: Cardiology

## 2023-08-14 ENCOUNTER — Encounter: Payer: Self-pay | Admitting: Cardiology

## 2023-08-14 ENCOUNTER — Ambulatory Visit: Payer: 59 | Attending: Cardiology | Admitting: Cardiology

## 2023-08-14 VITALS — BP 128/80 | HR 89 | Ht 65.0 in | Wt 205.0 lb

## 2023-08-14 DIAGNOSIS — I471 Supraventricular tachycardia, unspecified: Secondary | ICD-10-CM | POA: Diagnosis not present

## 2023-08-14 DIAGNOSIS — I10 Essential (primary) hypertension: Secondary | ICD-10-CM | POA: Diagnosis not present

## 2023-08-14 DIAGNOSIS — R7303 Prediabetes: Secondary | ICD-10-CM

## 2023-08-14 DIAGNOSIS — I2510 Atherosclerotic heart disease of native coronary artery without angina pectoris: Secondary | ICD-10-CM | POA: Diagnosis not present

## 2023-08-14 DIAGNOSIS — R0609 Other forms of dyspnea: Secondary | ICD-10-CM

## 2023-08-14 MED ORDER — CARVEDILOL 12.5 MG PO TABS: 12.5000 mg | ORAL_TABLET | Freq: Two times a day (BID) | ORAL | 3 refills | Status: DC

## 2023-08-14 MED ORDER — HYDROCHLOROTHIAZIDE 12.5 MG PO CAPS: 12.5000 mg | ORAL_CAPSULE | Freq: Every day | ORAL | 3 refills | Status: DC

## 2023-08-14 NOTE — Addendum Note (Signed)
Addended by: Baldo Ash D on: 08/14/2023 04:43 PM   Modules accepted: Orders

## 2023-08-14 NOTE — Progress Notes (Signed)
Cardiology Office Note:    Date:  08/14/2023   ID:  Julia Mckinney, DOB 1961/03/22, MRN [id number]  PCP:  Loura Pardon, PA  Cardiologist:  Gypsy Balsam, MD    Referring MD: Loura Pardon*   Chief Complaint  Patient presents with   Tachycardia   Dizziness   Shortness of Breath    History of Present Illness:    Julia Mckinney is a 62 y.o. female with past medical history significant for supraventricular tachycardia, essential hypertension, hyperlipidemia, coronary artery disease.  She did have coronary CT angio which showed 25 to 49% stenosis of proximal LAD.  Also obtuse marginal branch. Comes today to months for the yearly follow-up she said that she had a little more palpitations especially the last month she get palpitation more frequently she felt her heart speeding up.  Also described to have some dizziness when she is getting up very quickly and her complaint she has is chest sensation of the left side of her chest that happen usually when she is sitting.  She is trying to be a little more active trying to walk a little more and she does not have any sensation like this while walking.  Past Medical History:  Diagnosis Date   Astigmatism of left eye 01/09/2012   Bilateral dry eyes 01/09/2012   Collapsed lung    Endometriosis    Family history of breast cancer 02/03/2019   Generalized anxiety disorder 09/30/2019   History of hysterectomy 02/22/2020   Keratitis 08/29/2011   Formatting of this note might be different from the original. 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   carvedilol (COREG) 6.25 MG tablet TAKE 1 TABLET BY MOUTH 2 TIMES A DAY WITH A MEAL MUST KEEP APPOINTMENT FOR FURTHER REFILLS (Patient taking differently: Take 6.25 mg by mouth 2 (two) times daily with a meal.)   hydrochlorothiazide (HYDRODIURIL) 25 MG tablet Take 1 tablet (25 mg total) by mouth daily. Patient must keep appointment on 08/14/23 for further refills. 3 rd/final attempt   Multiple Vitamins-Minerals (MULTIVITAMIN WITH MINERALS) tablet Take 1 tablet by mouth daily.   rosuvastatin (CRESTOR) 10 MG tablet Take 1 tablet (10 mg total) by mouth daily. Patient needs to keep appointment for further refills. 2nd attempt   valACYclovir (VALTREX) 1000 MG tablet Take 1,000 mg by mouth daily.      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 [employer]  Tobacco Use   Smoking status: Never   Smokeless tobacco: Never  Substance and Sexual Activity   Alcohol use: No   Drug use: No   Sexual activity: Not on file  Other Topics Concern  Not on file  Social History Narrative   Not on file   Social Determinants of Health   Financial Resource Strain: Low Risk  (02/19/2022)   Received from [facility], [facility]   Overall Financial Resource Strain (CARDIA)    Difficulty of Paying Living Expenses: Not very hard  Food Insecurity: No Food Insecurity (02/19/2022)   Received from [facility], [facility]   Hunger Vital Sign    Worried About Running Out of Food in the Last Year: Never true    Ran Out of Food in the Last Year: Never true  Transportation Needs: Not on file  Physical Activity: Insufficiently Active (02/19/2022)    Received from [facility], [facility]   Exercise Vital Sign    Days of Exercise per Week: 2 days    Minutes of Exercise per Session: 30 min  Stress: Stress Concern Present (02/19/2022)   Received from [facility], [facility] - Occupational Stress Questionnaire    Feeling of Stress : To some extent  Social Connections: Unknown (03/15/2023)   Received from [facility], [facility]   Social Network    Social Network: Not on file     Family History: The patient's family history includes Asthma in her mother; Atrial fibrillation in her mother; Breast cancer in her sister; Diabetes in her mother, sister, and sister; Heart attack in her maternal grandfather and maternal grandmother; Heart attack (age of onset: 3) in her father; Hypertension in her mother. ROS:   Please see the history of present illness.    All 14 point review of systems negative except as described per history of present illness  EKGs/Labs/Other Studies Reviewed:    EKG Interpretation Date/Time:  Friday August 14 2023 15:55:17 EDT Ventricular Rate:  93 PR Interval:  146 QRS Duration:  104 QT Interval:  370 QTC Calculation: 460 R Axis:   57  Text Interpretation: Normal sinus rhythm Nonspecific ST abnormality When compared with ECG of 06-Mar-2020 12:43, No significant change was found Confirmed by Gypsy Balsam [phone]) on 08/14/2023 4:13:01 PM    Recent Labs: No results found for requested labs within last 365 days.  Recent Lipid Panel    Component Value Date/Time   CHOL 145 04/06/2020 0824   TRIG 84 04/06/2020 0824   HDL 58 04/06/2020 0824   CHOLHDL 2.5 04/06/2020 0824   LDLCALC 71 04/06/2020 0824   LDLDIRECT 55 02/25/2022 1312    Physical Exam:    VS:  BP 128/80 (BP Location: Left Arm, Patient Position: Sitting)   Pulse 89   Ht 5\' 5"  (1.651 m)   Wt 205 lb (93 kg)   SpO2 98%   BMI 34.11 kg/m     Wt Readings from Last 3 Encounters:   08/14/23 205 lb (93 kg)  02/20/22 199 lb (90.3 kg)  01/08/21 204 lb 1.6 oz (92.6 kg)     GEN:  Well nourished, well developed in no acute distress HEENT: Normal NECK: No JVD; No carotid bruits LYMPHATICS: No lymphadenopathy CARDIAC: RRR, no murmurs, no rubs, no gallops RESPIRATORY:  Clear to auscultation without rales, wheezing or rhonchi  ABDOMEN: Soft, non-tender, non-distended MUSCULOSKELETAL:  No edema; No deformity  SKIN: Warm and dry LOWER EXTREMITIES: no swelling NEUROLOGIC:  Alert and oriented x 3 PSYCHIATRIC:  Normal affect   ASSESSMENT:    1. SVT (supraventricular tachycardia)   2. Coronary artery disease involving native coronary artery of native heart without angina pectoris  3. Essential hypertension   4. Prediabetes    PLAN:    In order of problems listed above:  Supraventricular tachycardia look like she got some more arrhythmia.  I will ask her to have her carvedilol increased to 12.5 twice daily, will cut down her hydrochlorothiazide to only 12.5. Essential hypertension blood pressure well-controlled. Dizziness I think it is related to orthostatic hypotension hopefully cutting down hydrochlorothiazide will improve the situation. Dyslipidemia schedule her to have fasting lipid profile done. Dyspnea on exertion, echocardiogram will be done   Medication Adjustments/Labs and Tests Ordered: Current medicines are reviewed at length with the patient today.  Concerns regarding medicines are outlined above.  Orders Placed This Encounter  Procedures   EKG 12-Lead   Medication changes: No orders of the defined types were placed in this encounter.   Signed, Georgeanna Lea, MD, [facility] 08/14/2023 4:25 PM    [facility]

## 2023-08-14 NOTE — Patient Instructions (Addendum)
Medication Instructions:   INCREASE: Carvedilol to 12.5mg  twice daily- you may double your current dose and your next refill will reflect your new dose  DECREASE: hydrochlorothiazide to 12.5mg  daily- you may 1/2 your current dose   Lab Work: 3rd Floor   [address]  Your physician recommends that you return for lab work in:  when fasting  You need to have labs done when you are fasting.  You can come Monday through Friday 8:00 am to 11:30AM and 1:00 to 4:00. You do not need to make an appointment as the order has already been placed.   Testing/Procedures: Your physician has requested that you have an echocardiogram. Echocardiography is a painless test that uses sound waves to create images of your heart. It provides your doctor with information about the size and shape of your heart and how well your heart's chambers and valves are working. This procedure takes approximately one hour. There are no restrictions for this procedure. Please do NOT wear cologne, perfume, aftershave, or lotions (deodorant is allowed). Please arrive 15 minutes prior to your appointment time.    Follow-Up: At [facility], you and your health needs are our priority.  As part of our continuing mission to provide you with exceptional heart care, we have created designated Provider Care Teams.  These Care Teams include your primary Cardiologist (physician) and Advanced Practice Providers (APPs -  Physician Assistants and Nurse Practitioners) who all work together to provide you with the care you need, when you need it.  We recommend signing up for the patient portal called "MyChart".  Sign up information is provided on this After Visit Summary.  MyChart is used to connect with patients for Virtual Visits (Telemedicine).  Patients are able to view lab/test results, encounter notes, upcoming appointments, etc.  Non-urgent messages can be sent to your provider as well.   To learn more about what you can do with MyChart, go  to [email or web address].    Your next appointment:   2 month(s)  The format for your next appointment:   In Person  Provider:   Gypsy Balsam, MD    Other Instructions NA

## 2023-08-16 ENCOUNTER — Other Ambulatory Visit: Payer: Self-pay | Admitting: Cardiology

## 2023-08-17 ENCOUNTER — Other Ambulatory Visit: Payer: Self-pay | Admitting: Cardiology

## 2023-08-21 LAB — LIPID PANEL
Chol/HDL Ratio: 2.5 ratio (ref 0.0–4.4)
Cholesterol, Total: 143 mg/dL (ref 100–199)
HDL: 57 mg/dL (ref >39)
LDL Chol Calc (NIH): 70 mg/dL (ref 0–99)
Triglycerides: 83 mg/dL (ref 0–149)
VLDL Cholesterol Cal: 16 mg/dL (ref 5–40)

## 2023-08-21 LAB — ALT: ALT: 25 IU/L (ref 0–32)

## 2023-08-21 LAB — AST: AST: 20 IU/L (ref 0–40)

## 2023-08-28 ENCOUNTER — Telehealth: Payer: Self-pay

## 2023-08-28 NOTE — Telephone Encounter (Signed)
-----   Message from Gypsy Balsam sent at 08/21/2023 10:10 AM EDT ----- Cholesterol looks good continue present management

## 2023-08-28 NOTE — Telephone Encounter (Signed)
Patient notified through my chart.

## 2023-09-14 ENCOUNTER — Ambulatory Visit (HOSPITAL_BASED_OUTPATIENT_CLINIC_OR_DEPARTMENT_OTHER)
Admission: RE | Admit: 2023-09-14 | Discharge: 2023-09-14 | Disposition: A | Discharge status: Home / Self Care | Payer: 59 | Source: Ambulatory Visit | Attending: Cardiology | Admitting: Cardiology

## 2023-09-14 DIAGNOSIS — R0609 Other forms of dyspnea: Secondary | ICD-10-CM | POA: Insufficient documentation

## 2023-09-15 LAB — ECHOCARDIOGRAM COMPLETE
AR max vel: 2.77 cm2
AV Area VTI: 2.99 cm2
AV Area mean vel: 3.09 cm2
AV Mean grad: 4 mm[Hg]
AV Peak grad: 6.9 mm[Hg]
Ao pk vel: 1.32 m/s
Area-P 1/2: 3.74 cm2
Calc EF: 64.4 %
MV M vel: 4.79 m/s
MV Peak grad: 91.8 mm[Hg]
MV Vena cont: 0.2 cm
P 1/2 time: 2908 ms
Radius: 0.35 cm
S' Lateral: 3.3 cm
Single Plane A2C EF: 68.4 %
Single Plane A4C EF: 59.7 %

## 2023-09-23 ENCOUNTER — Telehealth: Payer: Self-pay

## 2023-09-23 NOTE — Telephone Encounter (Signed)
Patient notified of results and verbalized understanding.  

## 2023-09-23 NOTE — Telephone Encounter (Signed)
-----   Message from Gypsy Balsam sent at 09/21/2023 12:13 PM EDT ----- Echocardiogram showed preserved left ventricle ejection fraction, mild mitral valve regurgitation, continue present management

## 2023-10-23 ENCOUNTER — Other Ambulatory Visit: Payer: Self-pay | Admitting: Physician Assistant

## 2023-10-23 DIAGNOSIS — Z1231 Encounter for screening mammogram for malignant neoplasm of breast: Secondary | ICD-10-CM

## 2023-11-04 ENCOUNTER — Ambulatory Visit: Payer: 59

## 2023-11-04 DIAGNOSIS — Z1231 Encounter for screening mammogram for malignant neoplasm of breast: Secondary | ICD-10-CM

## 2023-11-17 ENCOUNTER — Ambulatory Visit: Payer: 59 | Admitting: Cardiology

## 2023-12-29 ENCOUNTER — Ambulatory Visit: Payer: 59 | Admitting: Cardiology

## 2024-02-21 ENCOUNTER — Other Ambulatory Visit: Payer: Self-pay | Admitting: Cardiology

## 2024-08-13 ENCOUNTER — Other Ambulatory Visit: Payer: Self-pay | Admitting: Cardiology

## 2024-08-27 ENCOUNTER — Other Ambulatory Visit: Payer: Self-pay | Admitting: Cardiology

## 2024-09-01 ENCOUNTER — Ambulatory Visit: Admitting: Cardiology

## 2024-11-14 ENCOUNTER — Ambulatory Visit: Admitting: Cardiology

## 2024-11-20 ENCOUNTER — Other Ambulatory Visit: Payer: Self-pay | Admitting: Cardiology

## 2024-12-06 ENCOUNTER — Other Ambulatory Visit: Payer: Self-pay | Admitting: Physician Assistant

## 2024-12-06 DIAGNOSIS — Z1231 Encounter for screening mammogram for malignant neoplasm of breast: Secondary | ICD-10-CM

## 2025-01-05 ENCOUNTER — Encounter

## 2025-01-05 DIAGNOSIS — Z1231 Encounter for screening mammogram for malignant neoplasm of breast: Secondary | ICD-10-CM

## 2025-01-16 ENCOUNTER — Ambulatory Visit: Admitting: Family Medicine

## 2025-02-07 ENCOUNTER — Ambulatory Visit: Admitting: Cardiology
